# Patient Record
Sex: Female | Born: 1935 | Race: White | Hispanic: No
Health system: Southern US, Community
[De-identification: ages and names within clinical notes are randomized; demographics above are authoritative.]

## PROBLEM LIST (undated history)

## (undated) DIAGNOSIS — F0390 Unspecified dementia without behavioral disturbance: Secondary | ICD-10-CM

## (undated) DIAGNOSIS — I4891 Unspecified atrial fibrillation: Secondary | ICD-10-CM

## (undated) DIAGNOSIS — F10939 Alcohol use, unspecified with withdrawal, unspecified: Secondary | ICD-10-CM

## (undated) DIAGNOSIS — I509 Heart failure, unspecified: Secondary | ICD-10-CM

## (undated) DIAGNOSIS — F1020 Alcohol dependence, uncomplicated: Secondary | ICD-10-CM

## (undated) DIAGNOSIS — E872 Acidosis, unspecified: Secondary | ICD-10-CM

## (undated) DIAGNOSIS — F10239 Alcohol dependence with withdrawal, unspecified: Secondary | ICD-10-CM

## (undated) DIAGNOSIS — I4892 Unspecified atrial flutter: Secondary | ICD-10-CM

---

## 2017-07-24 ENCOUNTER — Other Ambulatory Visit: Payer: Self-pay

## 2017-07-24 ENCOUNTER — Observation Stay (HOSPITAL_COMMUNITY)
Admission: EM | Admit: 2017-07-24 | Discharge: 2017-07-28 | Disposition: A | Discharge status: Skilled Nursing Facility | Payer: Medicare Other | Attending: Internal Medicine | Admitting: Internal Medicine

## 2017-07-24 ENCOUNTER — Encounter (HOSPITAL_COMMUNITY): Payer: Self-pay | Admitting: Emergency Medicine

## 2017-07-24 ENCOUNTER — Emergency Department (HOSPITAL_COMMUNITY): Payer: Medicare Other

## 2017-07-24 DIAGNOSIS — G3189 Other specified degenerative diseases of nervous system: Secondary | ICD-10-CM | POA: Diagnosis not present

## 2017-07-24 DIAGNOSIS — D649 Anemia, unspecified: Secondary | ICD-10-CM | POA: Diagnosis not present

## 2017-07-24 DIAGNOSIS — R7989 Other specified abnormal findings of blood chemistry: Secondary | ICD-10-CM

## 2017-07-24 DIAGNOSIS — I509 Heart failure, unspecified: Secondary | ICD-10-CM | POA: Diagnosis not present

## 2017-07-24 DIAGNOSIS — F419 Anxiety disorder, unspecified: Secondary | ICD-10-CM | POA: Diagnosis not present

## 2017-07-24 DIAGNOSIS — F0390 Unspecified dementia without behavioral disturbance: Secondary | ICD-10-CM | POA: Diagnosis not present

## 2017-07-24 DIAGNOSIS — I480 Paroxysmal atrial fibrillation: Secondary | ICD-10-CM | POA: Diagnosis not present

## 2017-07-24 DIAGNOSIS — I110 Hypertensive heart disease with heart failure: Secondary | ICD-10-CM | POA: Diagnosis not present

## 2017-07-24 DIAGNOSIS — I700 Atherosclerosis of aorta: Secondary | ICD-10-CM | POA: Insufficient documentation

## 2017-07-24 DIAGNOSIS — B9689 Other specified bacterial agents as the cause of diseases classified elsewhere: Secondary | ICD-10-CM | POA: Diagnosis not present

## 2017-07-24 DIAGNOSIS — F1010 Alcohol abuse, uncomplicated: Secondary | ICD-10-CM | POA: Diagnosis present

## 2017-07-24 DIAGNOSIS — G9340 Encephalopathy, unspecified: Secondary | ICD-10-CM | POA: Diagnosis present

## 2017-07-24 DIAGNOSIS — R269 Unspecified abnormalities of gait and mobility: Secondary | ICD-10-CM | POA: Insufficient documentation

## 2017-07-24 DIAGNOSIS — G9341 Metabolic encephalopathy: Secondary | ICD-10-CM | POA: Diagnosis not present

## 2017-07-24 DIAGNOSIS — L8990 Pressure ulcer of unspecified site, unspecified stage: Secondary | ICD-10-CM | POA: Diagnosis not present

## 2017-07-24 DIAGNOSIS — I4892 Unspecified atrial flutter: Secondary | ICD-10-CM | POA: Insufficient documentation

## 2017-07-24 DIAGNOSIS — F329 Major depressive disorder, single episode, unspecified: Secondary | ICD-10-CM | POA: Insufficient documentation

## 2017-07-24 DIAGNOSIS — E871 Hypo-osmolality and hyponatremia: Secondary | ICD-10-CM | POA: Diagnosis not present

## 2017-07-24 DIAGNOSIS — R296 Repeated falls: Secondary | ICD-10-CM | POA: Diagnosis not present

## 2017-07-24 DIAGNOSIS — N390 Urinary tract infection, site not specified: Secondary | ICD-10-CM | POA: Diagnosis present

## 2017-07-24 DIAGNOSIS — Z79899 Other long term (current) drug therapy: Secondary | ICD-10-CM | POA: Diagnosis not present

## 2017-07-24 DIAGNOSIS — N3000 Acute cystitis without hematuria: Secondary | ICD-10-CM

## 2017-07-24 DIAGNOSIS — R778 Other specified abnormalities of plasma proteins: Secondary | ICD-10-CM

## 2017-07-24 DIAGNOSIS — I447 Left bundle-branch block, unspecified: Secondary | ICD-10-CM

## 2017-07-24 DIAGNOSIS — N179 Acute kidney failure, unspecified: Secondary | ICD-10-CM | POA: Diagnosis present

## 2017-07-24 HISTORY — DX: Unspecified atrial fibrillation: I48.91

## 2017-07-24 HISTORY — DX: Alcohol dependence, uncomplicated: F10.20

## 2017-07-24 HISTORY — DX: Unspecified atrial flutter: I48.92

## 2017-07-24 HISTORY — DX: Alcohol dependence with withdrawal, unspecified: F10.239

## 2017-07-24 HISTORY — DX: Acidosis: E87.2

## 2017-07-24 HISTORY — DX: Acidosis, unspecified: E87.20

## 2017-07-24 HISTORY — DX: Heart failure, unspecified: I50.9

## 2017-07-24 HISTORY — DX: Alcohol use, unspecified with withdrawal, unspecified: F10.939

## 2017-07-24 LAB — CBC WITH DIFFERENTIAL/PLATELET
BASOS ABS: 0 10*3/uL (ref 0.0–0.1)
Basophils Relative: 0 %
Eosinophils Absolute: 0 10*3/uL (ref 0.0–0.7)
Eosinophils Relative: 0 %
HEMATOCRIT: 34.6 % — AB (ref 36.0–46.0)
HEMOGLOBIN: 11.5 g/dL — AB (ref 12.0–15.0)
LYMPHS PCT: 8 %
Lymphs Abs: 0.7 10*3/uL (ref 0.7–4.0)
MCH: 30.6 pg (ref 26.0–34.0)
MCHC: 33.2 g/dL (ref 30.0–36.0)
MCV: 92 fL (ref 78.0–100.0)
MONO ABS: 1 10*3/uL (ref 0.1–1.0)
MONOS PCT: 10 %
NEUTROS ABS: 7.7 10*3/uL (ref 1.7–7.7)
NEUTROS PCT: 82 %
Platelets: 340 10*3/uL (ref 150–400)
RBC: 3.76 MIL/uL — ABNORMAL LOW (ref 3.87–5.11)
RDW: 15.3 % (ref 11.5–15.5)
WBC: 9.4 10*3/uL (ref 4.0–10.5)

## 2017-07-24 LAB — COMPREHENSIVE METABOLIC PANEL
ALK PHOS: 77 U/L (ref 38–126)
ALT: 18 U/L (ref 14–54)
AST: 19 U/L (ref 15–41)
Albumin: 3.4 g/dL — ABNORMAL LOW (ref 3.5–5.0)
Anion gap: 11 (ref 5–15)
BILIRUBIN TOTAL: 1 mg/dL (ref 0.3–1.2)
BUN: 26 mg/dL — AB (ref 6–20)
CALCIUM: 9.8 mg/dL (ref 8.9–10.3)
CO2: 23 mmol/L (ref 22–32)
Chloride: 95 mmol/L — ABNORMAL LOW (ref 101–111)
Creatinine, Ser: 1.2 mg/dL — ABNORMAL HIGH (ref 0.44–1.00)
GFR calc Af Amer: 46 mL/min — ABNORMAL LOW (ref >60)
GFR, EST NON AFRICAN AMERICAN: 40 mL/min — AB (ref >60)
GLUCOSE: 106 mg/dL — AB (ref 65–99)
POTASSIUM: 5 mmol/L (ref 3.5–5.1)
Sodium: 129 mmol/L — ABNORMAL LOW (ref 135–145)
TOTAL PROTEIN: 7 g/dL (ref 6.5–8.1)

## 2017-07-24 LAB — URINALYSIS, ROUTINE W REFLEX MICROSCOPIC
BILIRUBIN URINE: NEGATIVE
Glucose, UA: NEGATIVE mg/dL
Ketones, ur: 5 mg/dL — AB
Nitrite: POSITIVE — AB
PH: 5 (ref 5.0–8.0)
Protein, ur: NEGATIVE mg/dL
SPECIFIC GRAVITY, URINE: 1.011 (ref 1.005–1.030)

## 2017-07-24 LAB — RAPID URINE DRUG SCREEN, HOSP PERFORMED
AMPHETAMINES: NOT DETECTED
BARBITURATES: NOT DETECTED
BENZODIAZEPINES: POSITIVE — AB
COCAINE: NOT DETECTED
OPIATES: NOT DETECTED
TETRAHYDROCANNABINOL: NOT DETECTED

## 2017-07-24 LAB — ETHANOL

## 2017-07-24 LAB — TROPONIN I: TROPONIN I: 0.06 ng/mL — AB (ref <0.03)

## 2017-07-24 LAB — I-STAT TROPONIN, ED: Troponin i, poc: 0.07 ng/mL (ref 0.00–0.08)

## 2017-07-24 MED ORDER — DOCUSATE SODIUM 100 MG PO CAPS: 100.0000 mg | ORAL_CAPSULE | Freq: Every day | ORAL | Status: DC | PRN

## 2017-07-24 MED ORDER — MECLIZINE HCL 25 MG PO TABS: 12.5000 mg | ORAL_TABLET | Freq: Three times a day (TID) | ORAL | Status: DC | PRN

## 2017-07-24 MED ORDER — ONDANSETRON HCL 4 MG/2ML IJ SOLN: 4.0000 mg | Freq: Four times a day (QID) | INTRAMUSCULAR | Status: DC | PRN

## 2017-07-24 MED ORDER — ENOXAPARIN SODIUM 40 MG/0.4ML ~~LOC~~ SOLN
40.0000 mg | SUBCUTANEOUS | Status: DC
  Administered 2017-07-25 – 2017-07-28 (×4): 40 mg via SUBCUTANEOUS
  Filled 2017-07-24 (×4): qty 0.4

## 2017-07-24 MED ORDER — LORAZEPAM 1 MG PO TABS
1.0000 mg | ORAL_TABLET | Freq: Four times a day (QID) | ORAL | Status: AC | PRN
  Administered 2017-07-25 – 2017-07-27 (×2): 1 mg via ORAL
  Filled 2017-07-24 (×2): qty 1

## 2017-07-24 MED ORDER — CALCIUM CARBONATE-VITAMIN D 500-200 MG-UNIT PO TABS
0.5000 | ORAL_TABLET | Freq: Every day | ORAL | Status: DC
  Administered 2017-07-25 – 2017-07-28 (×4): 0.5 via ORAL
  Filled 2017-07-24 (×4): qty 1

## 2017-07-24 MED ORDER — SODIUM CHLORIDE 0.9 % IV BOLUS (SEPSIS)
1000.0000 mL | Freq: Once | INTRAVENOUS | Status: AC
  Administered 2017-07-24: 1000 mL via INTRAVENOUS

## 2017-07-24 MED ORDER — ACETAMINOPHEN 325 MG PO TABS
650.0000 mg | ORAL_TABLET | Freq: Four times a day (QID) | ORAL | Status: DC | PRN
  Administered 2017-07-27: 650 mg via ORAL
  Filled 2017-07-24: qty 2

## 2017-07-24 MED ORDER — SODIUM CHLORIDE 0.9 % IV SOLN
Freq: Once | INTRAVENOUS | Status: AC
  Administered 2017-07-24: via INTRAVENOUS

## 2017-07-24 MED ORDER — METOPROLOL SUCCINATE ER 50 MG PO TB24
50.0000 mg | ORAL_TABLET | Freq: Every day | ORAL | Status: DC
  Administered 2017-07-25 – 2017-07-28 (×4): 50 mg via ORAL
  Filled 2017-07-24 (×4): qty 1

## 2017-07-24 MED ORDER — DEXTROSE 5 % IV SOLN
1.0000 g | INTRAVENOUS | Status: DC
  Administered 2017-07-25: 1 g via INTRAVENOUS
  Filled 2017-07-24 (×2): qty 10

## 2017-07-24 MED ORDER — DIGOXIN 125 MCG PO TABS
0.1250 mg | ORAL_TABLET | Freq: Every day | ORAL | Status: DC
  Administered 2017-07-25 – 2017-07-28 (×4): 0.125 mg via ORAL
  Filled 2017-07-24 (×4): qty 1

## 2017-07-24 MED ORDER — SERTRALINE HCL 50 MG PO TABS
25.0000 mg | ORAL_TABLET | Freq: Every day | ORAL | Status: DC
  Administered 2017-07-25 – 2017-07-27 (×4): 25 mg via ORAL
  Filled 2017-07-24 (×4): qty 1

## 2017-07-24 MED ORDER — ACETAMINOPHEN 650 MG RE SUPP: 650.0000 mg | Freq: Four times a day (QID) | RECTAL | Status: DC | PRN

## 2017-07-24 MED ORDER — VITAMIN B-1 100 MG PO TABS
100.0000 mg | ORAL_TABLET | Freq: Every day | ORAL | Status: DC
  Administered 2017-07-25 – 2017-07-28 (×4): 100 mg via ORAL
  Filled 2017-07-24 (×4): qty 1

## 2017-07-24 MED ORDER — DEXTROSE 5 % IV SOLN
1.0000 g | Freq: Once | INTRAVENOUS | Status: AC
  Administered 2017-07-24: 1 g via INTRAVENOUS
  Filled 2017-07-24: qty 10

## 2017-07-24 MED ORDER — FOLIC ACID 1 MG PO TABS
1.0000 mg | ORAL_TABLET | Freq: Every day | ORAL | Status: DC
  Administered 2017-07-25 – 2017-07-28 (×4): 1 mg via ORAL
  Filled 2017-07-24 (×4): qty 1

## 2017-07-24 MED ORDER — TRAMADOL HCL 50 MG PO TABS: 50.0000 mg | ORAL_TABLET | Freq: Two times a day (BID) | ORAL | Status: DC | PRN

## 2017-07-24 MED ORDER — ALBUTEROL SULFATE (2.5 MG/3ML) 0.083% IN NEBU: 2.5000 mg | INHALATION_SOLUTION | Freq: Four times a day (QID) | RESPIRATORY_TRACT | Status: DC | PRN

## 2017-07-24 MED ORDER — ONDANSETRON HCL 4 MG PO TABS: 4.0000 mg | ORAL_TABLET | Freq: Four times a day (QID) | ORAL | Status: DC | PRN

## 2017-07-24 MED ORDER — LORAZEPAM 2 MG/ML IJ SOLN
1.0000 mg | Freq: Four times a day (QID) | INTRAMUSCULAR | Status: AC | PRN
  Administered 2017-07-25: 1 mg via INTRAVENOUS
  Filled 2017-07-24: qty 1

## 2017-07-24 MED ORDER — DULOXETINE HCL 30 MG PO CPEP
30.0000 mg | ORAL_CAPSULE | Freq: Every day | ORAL | Status: DC
  Administered 2017-07-25 – 2017-07-28 (×4): 30 mg via ORAL
  Filled 2017-07-24 (×4): qty 1

## 2017-07-24 MED ORDER — THIAMINE HCL 100 MG/ML IJ SOLN: 100.0000 mg | Freq: Every day | INTRAMUSCULAR | Status: DC

## 2017-07-24 MED ORDER — ADULT MULTIVITAMIN W/MINERALS CH
1.0000 | ORAL_TABLET | Freq: Every day | ORAL | Status: DC
  Administered 2017-07-25 – 2017-07-28 (×4): 1 via ORAL
  Filled 2017-07-24 (×4): qty 1

## 2017-07-24 MED ORDER — PANTOPRAZOLE SODIUM 40 MG PO TBEC
40.0000 mg | DELAYED_RELEASE_TABLET | Freq: Every day | ORAL | Status: DC
Start: 2017-07-25 — End: 2017-07-29
  Administered 2017-07-25 – 2017-07-28 (×4): 40 mg via ORAL
  Filled 2017-07-24 (×4): qty 1

## 2017-07-24 NOTE — ED Triage Notes (Signed)
Pt BIB EMS from [facility] s/p fall. This is the third fall in the last month. Patient hx dementia, but known for heavily drinking. Patient was found to have 6 empty bottles of liquor in her room. Patient fell today in the bathroom. Patient did not hit her head and no LOC. Patient has no complaints except she wants a drink.

## 2017-07-24 NOTE — H&P (Signed)
History and Physical    Jamie Le LKG:[id number] DOB: 09/23/1929 DOA: 07/24/2017  PCP: Manus GunningMazurek, Maggie, FNP  Patient coming from: [facility]  I have personally briefly reviewed patient[facility]  Chief Complaint: Fall  HPI: Jamie Le is a 83 y.o. female with medical history significant of EtOH abuse, dementia, PAF.  Patient recently put in [facility].  Still drinking, apparently daughter still supplys patient with Liquor.  Presents to ED s/p Fall.  3rd fall this past month.  No LOC, no complaints (other than she wants a drink of wine).  Apparently more confused recently, according to family 3 falls in past 3 days.  Poor appetite for past couple of days with no PO intake.   ED Course: Work up in ED shows UTI, EtOH level of 0.   Review of Systems: As per HPI otherwise 10 point review of systems negative.   Past Medical History:  Diagnosis Date  . A-fib (HCC)   . Acidosis   . Alcohol withdrawal (HCC)   . Alcoholism (HCC)   . Atrial flutter (HCC)   . CHF (congestive heart failure) (HCC)     History reviewed. No pertinent surgical history.   has an unknown smoking status. she has never used smokeless tobacco. She reports that she drinks alcohol. She reports that she does not use drugs.  No Known Allergies  History reviewed. No pertinent family history.   Prior to Admission medications   Medication Sig Start Date End Date Taking? Authorizing Provider  albuterol (PROVENTIL) (2.5 MG/3ML) 0.083% nebulizer solution Take 2.5 mg by nebulization every 6 (six) hours as needed for wheezing or shortness of breath.   Yes [provider]  ALPRAZolam Prudy Feeler(XANAX) 0.5 MG tablet Take 0.5 mg by mouth every 6 (six) hours as needed for anxiety.   Yes [provider]  Calcium Carbonate-Vitamin D3 (CALCIUM 600-D) 600-400 MG-UNIT TABS Take 0.5 tablets by mouth daily.   Yes [provider]  digoxin (LANOXIN) 0.125 MG tablet Take 0.125 mg  by mouth daily.   Yes [provider]  docusate sodium (COLACE) 100 MG capsule Take 100 mg by mouth daily as needed for mild constipation.   Yes [provider]  DULoxetine (CYMBALTA) 30 MG capsule Take 30 mg by mouth daily.   Yes [provider]  lisinopril (PRINIVIL,ZESTRIL) 10 MG tablet Take 10 mg by mouth daily.   Yes [provider]  meclizine (ANTIVERT) 12.5 MG tablet Take 12.5 mg by mouth 3 (three) times daily as needed for dizziness.   Yes [provider]  Melatonin 3 MG TABS Take 3 mg by mouth daily.   Yes [provider]  Metoprolol Succinate 50 MG CS24 Take 50 mg by mouth daily.   Yes [provider]  omeprazole (PRILOSEC) 20 MG capsule Take 20 mg by mouth daily.   Yes [provider]  sertraline (ZOLOFT) 25 MG tablet Take 25 mg by mouth at bedtime.   Yes [provider]  spironolactone (ALDACTONE) 25 MG tablet Take 25 mg by mouth 2 (two) times daily.   Yes [provider]  traMADol (ULTRAM) 50 MG tablet Take 50 mg by mouth every 12 (twelve) hours as needed for moderate pain.   Yes [provider]    Physical Exam: Vitals:   07/24/17 1942 07/24/17 2201  BP: (!) 154/90 140/72  Pulse: 85 89  Resp: 18 (!) 29  Temp: 98.2 F (36.8 C)   TempSrc: Oral   SpO2:  95% 97%    Constitutional: NAD, calm, comfortable Eyes: PERRL, lids and conjunctivae normal ENMT: Mucous membranes are moist. Posterior pharynx clear of any exudate or lesions.Normal dentition.  Neck: normal, supple, no masses, no thyromegaly Respiratory: clear to auscultation bilaterally, no wheezing, no crackles. Normal respiratory effort. No accessory muscle use.  Cardiovascular: Regular rate and rhythm, no murmurs / rubs / gallops. No extremity edema. 2+ pedal pulses. No carotid bruits.  Abdomen: no tenderness, no masses palpated. No hepatosplenomegaly. Bowel sounds positive.  Musculoskeletal: no clubbing / cyanosis. No  joint deformity upper and lower extremities. Good ROM, no contractures. Normal muscle tone.  Skin: no rashes, lesions, ulcers. No induration Neurologic: CN 2-12 grossly intact. Sensation intact, DTR normal. Strength 5/5 in all 4.  Psychiatric: Oriented to person, asking for 2 hot dogs and a bottle of wine that she thought she saw on the table in her ER room   Labs on Admission: I have personally reviewed following labs and imaging studies  CBC: Recent Labs  Lab 07/24/17 2053  WBC 9.4  NEUTROABS 7.7  HGB 11.5*  HCT 34.6*  MCV 92.0  PLT 340   Basic Metabolic Panel: Recent Labs  Lab 07/24/17 2053  NA 129*  K 5.0  CL 95*  CO2 23  GLUCOSE 106*  BUN 26*  CREATININE 1.20*  CALCIUM 9.8   GFR: CrCl cannot be calculated (Unknown ideal weight.). Liver Function Tests: Recent Labs  Lab 07/24/17 2053  AST 19  ALT 18  ALKPHOS 77  BILITOT 1.0  PROT 7.0  ALBUMIN 3.4*   No results for input(s): LIPASE, AMYLASE in the last 168 hours. No results for input(s): AMMONIA in the last 168 hours. Coagulation Profile: No results for input(s): INR, PROTIME in the last 168 hours. Cardiac Enzymes: Recent Labs  Lab 07/24/17 2102  TROPONINI 0.06*   BNP (last 3 results) No results for input(s): PROBNP in the last 8760 hours. HbA1C: No results for input(s): HGBA1C in the last 72 hours. CBG: No results for input(s): GLUCAP in the last 168 hours. Lipid Profile: No results for input(s): CHOL, HDL, LDLCALC, TRIG, CHOLHDL, LDLDIRECT in the last 72 hours. Thyroid Function Tests: No results for input(s): TSH, T4TOTAL, FREET4, T3FREE, THYROIDAB in the last 72 hours. Anemia Panel: No results for input(s): VITAMINB12, FOLATE, FERRITIN, TIBC, IRON, RETICCTPCT in the last 72 hours. Urine analysis:    Component Value Date/Time   COLORURINE YELLOW 07/24/2017 2053   APPEARANCEUR CLOUDY (A) 07/24/2017 2053   LABSPEC 1.011 07/24/2017 2053   PHURINE 5.0 07/24/2017 2053   GLUCOSEU NEGATIVE  07/24/2017 2053   HGBUR SMALL (A) 07/24/2017 2053   BILIRUBINUR NEGATIVE 07/24/2017 2053   KETONESUR 5 (A) 07/24/2017 2053   PROTEINUR NEGATIVE 07/24/2017 2053   NITRITE POSITIVE (A) 07/24/2017 2053   LEUKOCYTESUR MODERATE (A) 07/24/2017 2053    Radiological Exams on Admission: Dg Chest 2 View  Result Date: 07/24/2017 CLINICAL DATA:  Weakness, falls EXAM: CHEST  2 VIEW COMPARISON:  None. FINDINGS: No acute pulmonary infiltrate or effusion. Borderline to mild cardiomegaly. Aortic atherosclerosis. No pneumothorax. Postsurgical changes in the right axillary region. Minimal convex opacity at the right cardio phrenic sulcus, could relate to possible hiatal hernia. IMPRESSION: 1. Negative for acute infiltrate or edema 2. Borderline to mild cardiomegaly. 3. Convex opacity at the right cardiophrenic sulcus, possible hiatal hernia, short interval radiographic follow-up recommended. Electronically Signed   By: Jasmine PangKim  Fujinaga M.D.   On: 07/24/2017 21:26   Ct Head Wo Contrast  Result  Date: 07/24/2017 CLINICAL DATA:  Headache EXAM: CT HEAD WITHOUT CONTRAST TECHNIQUE: Contiguous axial images were obtained from the base of the skull through the vertex without intravenous contrast. COMPARISON:  None. FINDINGS: Brain: No acute territorial infarction, hemorrhage or intracranial mass is visualized. Extensive atrophy. Mild to moderate small vessel ischemic changes of the white matter. Prominent ventricles felt secondary to atrophy. Vascular: No hyperdense vessels.  Carotid artery calcification. Skull: No fracture Sinuses/Orbits: No acute finding. Other: None IMPRESSION: 1. No CT evidence for acute intracranial abnormality. 2. Atrophy and small vessel ischemic changes of the white matter. Electronically Signed   By: Jasmine Pang M.D.   On: 07/24/2017 21:20    EKG: Independently reviewed.  Assessment/Plan Principal Problem:   UTI (urinary tract infection) Active Problems:   Acute encephalopathy   ETOH abuse    AKI (acute kidney injury) (HCC)    1. UTI - 1. Rocephin 2. Cultures pending 2. Acute encephalopathy on chronic dementia - likely secondary to UTI 3. EtOH abuse - CIWA 4. Mild AKI - 1. holding lisinopril and spironolactone 2. Repeat BMP in AM  DVT prophylaxis: Lovenox Code Status: Full Family Communication: No family in room Disposition Plan: [facility] after admit Consults called: None Admission status: Place in [address], [place]. DO [facility] Pager [phone]  If 7AM-7PM, please contact day team taking care of patient [email or web address] Password [email or web address]  07/24/2017, 11:40 PM

## 2017-07-24 NOTE — ED Notes (Signed)
Assigned room 1502 @2342  call report @0002 

## 2017-07-24 NOTE — ED Provider Notes (Signed)
[facility]-EMERGENCY DEPT Provider Note   CSN: [id number] Arrival date & time: 07/24/17  1922     History   Chief Complaint Chief Complaint  Patient presents with  . Fall    HPI Jamie Le is a 83 y.o. female.  HPI  83 year old female with past medical history as below who presents with weakness and fall.  The patient has a history of dementia and is currently more confused than baseline.  According to the family, the patient has fallen 3 times in the last 3 days.  She is been increasingly confused.  She has had generalized fatigue and has had poor appetite.  She has been walking off away from staff at her assisted living, then is found down for unknown periods of time.  She recently fell and struck the back of her head.  She denies any complaints at this time.  Denies any chest pain.  Denies any abdominal pain.  Denies any nausea or vomiting.  History is provided with the assistance of her daughter, who is present.  Level 5 caveat invoked as remainder of history, ROS, and physical exam limited due to patient's dementia.  Past Medical History:  Diagnosis Date  . A-fib (HCC)   . Acidosis   . Alcohol withdrawal (HCC)   . Alcoholism (HCC)   . Atrial flutter (HCC)   . CHF (congestive heart failure) [facility](HCC)     Patient Active Problem List   Diagnosis Date Noted  . UTI (urinary tract infection) 07/24/2017  . Acute encephalopathy 07/24/2017  . ETOH abuse 07/24/2017  . AKI (acute kidney injury) (HCC) 07/24/2017    History reviewed. No pertinent surgical history.  OB History    No data available       Home Medications    Prior to Admission medications   Medication Sig Start Date End Date Taking? Authorizing Provider  albuterol (PROVENTIL) (2.5 MG/3ML) 0.083% nebulizer solution Take 2.5 mg by nebulization every 6 (six) hours as needed for wheezing or shortness of breath.   Yes [provider]  ALPRAZolam Prudy Feeler(XANAX) 0.5 MG tablet Take 0.5 mg  by mouth every 6 (six) hours as needed for anxiety.   Yes [provider]  Calcium Carbonate-Vitamin D3 (CALCIUM 600-D) 600-400 MG-UNIT TABS Take 0.5 tablets by mouth daily.   Yes [provider]  digoxin (LANOXIN) 0.125 MG tablet Take 0.125 mg by mouth daily.   Yes [provider]  docusate sodium (COLACE) 100 MG capsule Take 100 mg by mouth daily as needed for mild constipation.   Yes [provider]  DULoxetine (CYMBALTA) 30 MG capsule Take 30 mg by mouth daily.   Yes [provider]  lisinopril (PRINIVIL,ZESTRIL) 10 MG tablet Take 10 mg by mouth daily.   Yes [provider]  meclizine (ANTIVERT) 12.5 MG tablet Take 12.5 mg by mouth 3 (three) times daily as needed for dizziness.   Yes [provider]  Melatonin 3 MG TABS Take 3 mg by mouth daily.   Yes [provider]  Metoprolol Succinate 50 MG CS24 Take 50 mg by mouth daily.   Yes [provider]  omeprazole (PRILOSEC) 20 MG capsule Take 20 mg by mouth daily.   Yes [provider]  sertraline (ZOLOFT) 25 MG tablet Take 25 mg by mouth at bedtime.   Yes [provider]  spironolactone (ALDACTONE) 25 MG tablet Take 25 mg by mouth 2 (two) times daily.   Yes [provider]  traMADol Janean Sark(ULTRAM) 50  MG tablet Take 50 mg by mouth every 12 (twelve) hours as needed for moderate pain.   Yes [provider]    Family History History reviewed. No pertinent family history.  Social History Social History   Tobacco Use  . Smoking status: Unknown If Ever Smoked  . Smokeless tobacco: Never Used  Substance Use Topics  . Alcohol use: Yes    Comment: Moderate use  . Drug use: No     Allergies   Patient has no known allergies.   Review of Systems Review of Systems  Unable to perform ROS: Mental status change  Constitutional: Positive for fatigue.     Physical Exam Updated Vital Signs BP 140/72 (BP Location: Right Arm)   Pulse  89   Temp 98.2 F (36.8 C) (Oral)   Resp (!) 29   SpO2 97%   Physical Exam  Constitutional: She is oriented to person, place, and time. She appears well-developed and well-nourished. No distress.  HENT:  Head: Normocephalic and atraumatic.  Mildly dry mucous membranes  Eyes: Conjunctivae are normal.  Neck: Neck supple.  Cardiovascular: Normal rate, regular rhythm and normal heart sounds. Exam reveals no friction rub.  No murmur heard. Pulmonary/Chest: Effort normal and breath sounds normal. No respiratory distress. She has no wheezes. She has no rales.  Abdominal: She exhibits no distension. There is no tenderness. There is no guarding.  Nontender  Musculoskeletal: She exhibits no edema.  Neurological: She is alert and oriented to person, place, and time. She exhibits normal muscle tone.  Skin: Skin is warm. Capillary refill takes less than 2 seconds.  Psychiatric: She has a normal mood and affect.  Nursing note and vitals reviewed.    ED Treatments / Results  Labs (all labs ordered are listed, but only abnormal results are displayed) Labs Reviewed  CBC WITH DIFFERENTIAL/PLATELET - Abnormal; Notable for the following components:      Result Value   RBC 3.76 (*)    Hemoglobin 11.5 (*)    HCT 34.6 (*)    All other components within normal limits  COMPREHENSIVE METABOLIC PANEL - Abnormal; Notable for the following components:   Sodium 129 (*)    Chloride 95 (*)    Glucose, Bld 106 (*)    BUN 26 (*)    Creatinine, Ser 1.20 (*)    Albumin 3.4 (*)    GFR calc non Af Amer 40 (*)    GFR calc Af Amer 46 (*)    All other components within normal limits  URINALYSIS, ROUTINE W REFLEX MICROSCOPIC - Abnormal; Notable for the following components:   APPearance CLOUDY (*)    Hgb urine dipstick SMALL (*)    Ketones, ur 5 (*)    Nitrite POSITIVE (*)    Leukocytes, UA MODERATE (*)    Bacteria, UA MANY (*)    Squamous Epithelial / LPF 0-5 (*)    All other components within normal  limits  RAPID URINE DRUG SCREEN, HOSP PERFORMED - Abnormal; Notable for the following components:   Benzodiazepines POSITIVE (*)    All other components within normal limits  TROPONIN I - Abnormal; Notable for the following components:   Troponin I 0.06 (*)    All other components within normal limits  URINE CULTURE  ETHANOL  CBC  BASIC METABOLIC PANEL  I-STAT TROPONIN, ED    EKG  EKG Interpretation  Date/Time:  Monday July 24 2017 21:54:19 EST Ventricular Rate:  87 PR Interval:    QRS Duration:  129 QT Interval:  361 QTC Calculation: 435 R Axis:   -45 Text Interpretation:  Sinus rhythm Left bundle branch block No old tracing to compare No sgarbossa criteria Confirmed by Shaune Pollack [phone]) on 07/25/2017 12:03:41 AM       Radiology Dg Chest 2 View  Result Date: 07/24/2017 CLINICAL DATA:  Weakness, falls EXAM: CHEST  2 VIEW COMPARISON:  None. FINDINGS: No acute pulmonary infiltrate or effusion. Borderline to mild cardiomegaly. Aortic atherosclerosis. No pneumothorax. Postsurgical changes in the right axillary region. Minimal convex opacity at the right cardio phrenic sulcus, could relate to possible hiatal hernia. IMPRESSION: 1. Negative for acute infiltrate or edema 2. Borderline to mild cardiomegaly. 3. Convex opacity at the right cardiophrenic sulcus, possible hiatal hernia, short interval radiographic follow-up recommended. Electronically Signed   By: Jasmine Pang M.D.   On: 07/24/2017 21:26   Ct Head Wo Contrast  Result Date: 07/24/2017 CLINICAL DATA:  Headache EXAM: CT HEAD WITHOUT CONTRAST TECHNIQUE: Contiguous axial images were obtained from the base of the skull through the vertex without intravenous contrast. COMPARISON:  None. FINDINGS: Brain: No acute territorial infarction, hemorrhage or intracranial mass is visualized. Extensive atrophy. Mild to moderate small vessel ischemic changes of the white matter. Prominent ventricles felt secondary to atrophy.  Vascular: No hyperdense vessels.  Carotid artery calcification. Skull: No fracture Sinuses/Orbits: No acute finding. Other: None IMPRESSION: 1. No CT evidence for acute intracranial abnormality. 2. Atrophy and small vessel ischemic changes of the white matter. Electronically Signed   By: Jasmine Pang M.D.   On: 07/24/2017 21:20    Procedures Procedures (including critical care time)  Medications Ordered in ED Medications  acetaminophen (TYLENOL) tablet 650 mg (not administered)    Or  acetaminophen (TYLENOL) suppository 650 mg (not administered)  ondansetron (ZOFRAN) tablet 4 mg (not administered)    Or  ondansetron (ZOFRAN) injection 4 mg (not administered)  enoxaparin (LOVENOX) injection 40 mg (not administered)  cefTRIAXone (ROCEPHIN) 1 g in dextrose 5 % 50 mL IVPB (not administered)  LORazepam (ATIVAN) tablet 1 mg (not administered)    Or  LORazepam (ATIVAN) injection 1 mg (not administered)  thiamine (VITAMIN B-1) tablet 100 mg (not administered)    Or  thiamine (B-1) injection 100 mg (not administered)  folic acid (FOLVITE) tablet 1 mg (not administered)  multivitamin with minerals tablet 1 tablet (not administered)  albuterol (PROVENTIL) (2.5 MG/3ML) 0.083% nebulizer solution 2.5 mg (not administered)  Calcium Carbonate-Vitamin D3 600-400 MG-UNIT TABS 0.5 tablet (not administered)  digoxin (LANOXIN) tablet 0.125 mg (not administered)  docusate sodium (COLACE) capsule 100 mg (not administered)  DULoxetine (CYMBALTA) DR capsule 30 mg (not administered)  meclizine (ANTIVERT) tablet 12.5 mg (not administered)  Metoprolol Succinate CS24 50 mg (not administered)  pantoprazole (PROTONIX) EC tablet 40 mg (not administered)  sertraline (ZOLOFT) tablet 25 mg (not administered)  traMADol (ULTRAM) tablet 50 mg (not administered)  sodium chloride 0.9 % bolus 1,000 mL (0 mLs Intravenous Stopped 07/24/17 2300)  cefTRIAXone (ROCEPHIN) 1 g in dextrose 5 % 50 mL IVPB (0 g Intravenous Stopped  07/24/17 2335)  0.9 %  sodium chloride infusion ( Intravenous New Bag/Given 07/24/17 2334)     Initial Impression / Assessment and Plan / ED Course  I have reviewed the triage vital signs and the nursing notes.  Pertinent labs & imaging results that were available during my care of the patient were reviewed by me and considered in my medical decision making (see chart for details).  83 year old female with past medical history as above here with generalized weakness, increasing falls, and poor p.o. intake.  Lab work is consistent with likely prerenal AK I due to poor p.o. intake with UTI.  She also has moderately elevated troponin, which I suspect is demand.  EKG shows a left bundle branch.  She has no chest pain currently.  There are no old tracings but given absence of any kind of chest pain, shortness of breath, or other complaints, doubt acute ACS.  Will need to be trended.  Will give Rocephin, fluids, and admit to medicine.  This note was prepared with assistance of Conservation officer, historic buildings. Occasional wrong-word or sound-a-like substitutions may have occurred due to the inherent limitations of voice recognition software.  Final Clinical Impressions(s) / ED Diagnoses   Final diagnoses:  AKI (acute kidney injury) (HCC)  Lower urinary tract infectious disease  Elevated troponin  LBBB (left bundle branch block)    ED Discharge Orders    None       Shaune Pollack, MD 07/25/17 0004

## 2017-07-24 NOTE — ED Notes (Signed)
[facility] Expected date:  Expected time:  Means of arrival:  Comments: 83 yo fall

## 2017-07-24 NOTE — ED Notes (Signed)
Lab to add on troponin and urine culture.

## 2017-07-25 ENCOUNTER — Encounter (HOSPITAL_COMMUNITY): Payer: Self-pay

## 2017-07-25 DIAGNOSIS — F1010 Alcohol abuse, uncomplicated: Secondary | ICD-10-CM | POA: Diagnosis not present

## 2017-07-25 DIAGNOSIS — F419 Anxiety disorder, unspecified: Secondary | ICD-10-CM

## 2017-07-25 DIAGNOSIS — N390 Urinary tract infection, site not specified: Secondary | ICD-10-CM

## 2017-07-25 DIAGNOSIS — N179 Acute kidney failure, unspecified: Secondary | ICD-10-CM | POA: Diagnosis not present

## 2017-07-25 DIAGNOSIS — B9689 Other specified bacterial agents as the cause of diseases classified elsewhere: Secondary | ICD-10-CM | POA: Diagnosis not present

## 2017-07-25 DIAGNOSIS — L8990 Pressure ulcer of unspecified site, unspecified stage: Secondary | ICD-10-CM

## 2017-07-25 DIAGNOSIS — G9341 Metabolic encephalopathy: Secondary | ICD-10-CM | POA: Diagnosis not present

## 2017-07-25 DIAGNOSIS — I447 Left bundle-branch block, unspecified: Secondary | ICD-10-CM | POA: Diagnosis not present

## 2017-07-25 DIAGNOSIS — R748 Abnormal levels of other serum enzymes: Secondary | ICD-10-CM | POA: Diagnosis not present

## 2017-07-25 DIAGNOSIS — G9340 Encephalopathy, unspecified: Secondary | ICD-10-CM | POA: Diagnosis not present

## 2017-07-25 DIAGNOSIS — N3000 Acute cystitis without hematuria: Secondary | ICD-10-CM | POA: Diagnosis not present

## 2017-07-25 LAB — CBC
HCT: 31.7 % — ABNORMAL LOW (ref 36.0–46.0)
HEMOGLOBIN: 10.4 g/dL — AB (ref 12.0–15.0)
MCH: 30.1 pg (ref 26.0–34.0)
MCHC: 32.8 g/dL (ref 30.0–36.0)
MCV: 91.6 fL (ref 78.0–100.0)
PLATELETS: 324 10*3/uL (ref 150–400)
RBC: 3.46 MIL/uL — AB (ref 3.87–5.11)
RDW: 15.4 % (ref 11.5–15.5)
WBC: 7.7 10*3/uL (ref 4.0–10.5)

## 2017-07-25 LAB — BASIC METABOLIC PANEL
Anion gap: 9 (ref 5–15)
BUN: 19 mg/dL (ref 6–20)
CHLORIDE: 100 mmol/L — AB (ref 101–111)
CO2: 22 mmol/L (ref 22–32)
Calcium: 8.7 mg/dL — ABNORMAL LOW (ref 8.9–10.3)
Creatinine, Ser: 0.78 mg/dL (ref 0.44–1.00)
Glucose, Bld: 93 mg/dL (ref 65–99)
POTASSIUM: 4.1 mmol/L (ref 3.5–5.1)
SODIUM: 131 mmol/L — AB (ref 135–145)

## 2017-07-25 NOTE — Progress Notes (Signed)
PROGRESS NOTE  Jamie Le ZOX:[id number] DOB: 1930-01-25 DOA: 07/24/2017 PCP: Manus Gunning, FNP  HPI/Recap of past 24 hours: Jamie Le is a 83 y.o. female with medical history significant of EtOH abuse, dementia, PAF.  Patient is a resident of assisted living. Presents to ED s/p Fall.  3rd fall this past month.  No loss of consciousness. Per previous report more confused recently and 3 falls in past 3 days complicated by poor appetite and poor oral intake in the past 2 days.  Seen and examined at bedside. Alert but confused in the setting of dementia. Called family today twice and left message to call back.   Assessment/Plan: Principal Problem:   UTI (urinary tract infection) Active Problems:   Acute encephalopathy   ETOH abuse   AKI (acute kidney injury) (HCC)   Pressure injury of skin  Acute metabolic encephalopathy most likely 2/2 UTI, poa -Unable to reach family -Writer called family 07/25/17 and left message to be called back -IV rocephin day # 1  UTI, poa -urine cx in process -IV rocephin day#1 -afebrile no leukocytosis -cbc am  Etoh abuse with concern for withdrawal -CIWA protocol -vital signs stable  AKI, resolved -prerenal from dehydration -cr 0.70 from 1.20 -hold lisinopril and spironolactone -c/w IV fluid hydration  Ambulatory dysfunction -PT evaluate and treat -fall precaution when stable -multiple falls recently  Normocytic anemia -hg 10.4, mcv 91.6 -baseline hg 11 -no overt sign of bleeding -CBC am  HTN BP is stable Continue home meds  Anxiety/depression Continue home meds   Code Status: Full  Family Communication: Called number on the chart twice and left a message to call back  Disposition Plan: will stay another midnight to continue IV antibiotics   Consultants:  None  Procedures:  None   Antimicrobials:  IV  DVT prophylaxis:  sq lovenox 40 mg daily   Objective: Vitals:   07/25/17 0027 07/25/17 0245  07/25/17 0550 07/25/17 0800  BP: (!) 154/90  (!) 147/70 139/73  Pulse: 92  82 100  Resp: 20  20 20   Temp: 97.8 F (36.6 C)  98.2 F (36.8 C) 97.6 F (36.4 C)  TempSrc: Oral  Axillary Axillary  SpO2: 97%  95% 99%  Weight:  59 kg (130 lb 1.1 oz)    Height:  5\' 6"  (1.676 m)      Intake/Output Summary (Last 24 hours) at 07/25/2017 1409 Last data filed at 07/24/2017 2335 Gross per 24 hour  Intake 1050 ml  Output 850 ml  Net 200 ml   Filed Weights   07/25/17 0245  Weight: 59 kg (130 lb 1.1 oz)    Exam:  Constitutional: NAD, calm, comfortable Eyes: PERRL, lids and conjunctivae normal ENMT: Mucous membranes are moist. Posterior pharynx clear of any exudate or lesions.Normal dentition.  Neck: normal, supple, no masses, no thyromegaly Respiratory: clear to auscultation bilaterally, no wheezing, no crackles. Normal respiratory effort. No accessory muscle use.  Cardiovascular: Regular rate and rhythm, no murmurs / rubs / gallops. No extremity edema. 2+ pedal pulses. No carotid bruits.  Abdomen: no tenderness, no masses palpated. No hepatosplenomegaly. Bowel sounds positive.  Musculoskeletal: no clubbing / cyanosis. No joint deformity upper and lower extremities. Good ROM, no contractures. Normal muscle tone.  Skin: no rashes, lesions, ulcers. No induration Neurologic: CN 2-12 grossly intact. Sensation intact, DTR normal. Strength 5/5 in all 4.  Psych: unable to assess due to AMS   Data Reviewed: CBC: Recent Labs  Lab 07/24/17 2053 07/25/17 0504  WBC  9.4 7.7  NEUTROABS 7.7  --   HGB 11.5* 10.4*  HCT 34.6* 31.7*  MCV 92.0 91.6  PLT 340 324   Basic Metabolic Panel: Recent Labs  Lab 07/24/17 2053 07/25/17 0504  NA 129* 131*  K 5.0 4.1  CL 95* 100*  CO2 23 22  GLUCOSE 106* 93  BUN 26* 19  CREATININE 1.20* 0.78  CALCIUM 9.8 8.7*   GFR: Estimated Creatinine Clearance: 47.9 mL/min (by C-G formula based on SCr of 0.78 mg/dL). Liver Function Tests: Recent Labs  Lab  07/24/17 2053  AST 19  ALT 18  ALKPHOS 77  BILITOT 1.0  PROT 7.0  ALBUMIN 3.4*   No results for input(s): LIPASE, AMYLASE in the last 168 hours. No results for input(s): AMMONIA in the last 168 hours. Coagulation Profile: No results for input(s): INR, PROTIME in the last 168 hours. Cardiac Enzymes: Recent Labs  Lab 07/24/17 2102  TROPONINI 0.06*   BNP (last 3 results) No results for input(s): PROBNP in the last 8760 hours. HbA1C: No results for input(s): HGBA1C in the last 72 hours. CBG: No results for input(s): GLUCAP in the last 168 hours. Lipid Profile: No results for input(s): CHOL, HDL, LDLCALC, TRIG, CHOLHDL, LDLDIRECT in the last 72 hours. Thyroid Function Tests: No results for input(s): TSH, T4TOTAL, FREET4, T3FREE, THYROIDAB in the last 72 hours. Anemia Panel: No results for input(s): VITAMINB12, FOLATE, FERRITIN, TIBC, IRON, RETICCTPCT in the last 72 hours. Urine analysis:    Component Value Date/Time   COLORURINE YELLOW 07/24/2017 2053   APPEARANCEUR CLOUDY (A) 07/24/2017 2053   LABSPEC 1.011 07/24/2017 2053   PHURINE 5.0 07/24/2017 2053   GLUCOSEU NEGATIVE 07/24/2017 2053   HGBUR SMALL (A) 07/24/2017 2053   BILIRUBINUR NEGATIVE 07/24/2017 2053   KETONESUR 5 (A) 07/24/2017 2053   PROTEINUR NEGATIVE 07/24/2017 2053   NITRITE POSITIVE (A) 07/24/2017 2053   LEUKOCYTESUR MODERATE (A) 07/24/2017 2053   Sepsis Labs: @LABRCNTIP (procalcitonin:4,lacticidven:4)  )No results found for this or any previous visit (from the past 240 hour(s)).    Studies: Dg Chest 2 View  Result Date: 07/24/2017 CLINICAL DATA:  Weakness, falls EXAM: CHEST  2 VIEW COMPARISON:  None. FINDINGS: No acute pulmonary infiltrate or effusion. Borderline to mild cardiomegaly. Aortic atherosclerosis. No pneumothorax. Postsurgical changes in the right axillary region. Minimal convex opacity at the right cardio phrenic sulcus, could relate to possible hiatal hernia. IMPRESSION: 1. Negative for  acute infiltrate or edema 2. Borderline to mild cardiomegaly. 3. Convex opacity at the right cardiophrenic sulcus, possible hiatal hernia, short interval radiographic follow-up recommended. Electronically Signed   By: Jasmine PangKim  Fujinaga M.D.   On: 07/24/2017 21:26   Ct Head Wo Contrast  Result Date: 07/24/2017 CLINICAL DATA:  Headache EXAM: CT HEAD WITHOUT CONTRAST TECHNIQUE: Contiguous axial images were obtained from the base of the skull through the vertex without intravenous contrast. COMPARISON:  None. FINDINGS: Brain: No acute territorial infarction, hemorrhage or intracranial mass is visualized. Extensive atrophy. Mild to moderate small vessel ischemic changes of the white matter. Prominent ventricles felt secondary to atrophy. Vascular: No hyperdense vessels.  Carotid artery calcification. Skull: No fracture Sinuses/Orbits: No acute finding. Other: None IMPRESSION: 1. No CT evidence for acute intracranial abnormality. 2. Atrophy and small vessel ischemic changes of the white matter. Electronically Signed   By: Jasmine PangKim  Fujinaga M.D.   On: 07/24/2017 21:20    Scheduled Meds: . calcium-vitamin D  0.5 tablet Oral Daily  . digoxin  0.125 mg Oral Daily  .  DULoxetine  30 mg Oral Daily  . enoxaparin (LOVENOX) injection  40 mg Subcutaneous Q24H  . folic acid  1 mg Oral Daily  . metoprolol succinate  50 mg Oral Daily  . multivitamin with minerals  1 tablet Oral Daily  . pantoprazole  40 mg Oral Daily  . sertraline  25 mg Oral QHS  . thiamine  100 mg Oral Daily   Or  . thiamine  100 mg Intravenous Daily    Continuous Infusions: . cefTRIAXone (ROCEPHIN)  IV       LOS: 0 days     Darlin Droparole N Thula Stewart, MD [facility] Pager [phone]  If 7PM-7AM, please contact night-coverage [email or web address] Password [email or web address] 07/25/2017, 2:09 PM

## 2017-07-26 DIAGNOSIS — N3000 Acute cystitis without hematuria: Secondary | ICD-10-CM | POA: Diagnosis not present

## 2017-07-26 DIAGNOSIS — R748 Abnormal levels of other serum enzymes: Secondary | ICD-10-CM | POA: Diagnosis not present

## 2017-07-26 DIAGNOSIS — G9340 Encephalopathy, unspecified: Secondary | ICD-10-CM | POA: Diagnosis not present

## 2017-07-26 DIAGNOSIS — N179 Acute kidney failure, unspecified: Secondary | ICD-10-CM | POA: Diagnosis not present

## 2017-07-26 LAB — BASIC METABOLIC PANEL
ANION GAP: 9 (ref 5–15)
BUN: 12 mg/dL (ref 6–20)
CALCIUM: 8.6 mg/dL — AB (ref 8.9–10.3)
CO2: 20 mmol/L — AB (ref 22–32)
Chloride: 102 mmol/L (ref 101–111)
Creatinine, Ser: 0.69 mg/dL (ref 0.44–1.00)
GFR calc Af Amer: >60 mL/min (ref >60)
GLUCOSE: 88 mg/dL (ref 65–99)
Potassium: 4.1 mmol/L (ref 3.5–5.1)
Sodium: 131 mmol/L — ABNORMAL LOW (ref 135–145)

## 2017-07-26 LAB — CBC
HEMATOCRIT: 32.4 % — AB (ref 36.0–46.0)
Hemoglobin: 10.6 g/dL — ABNORMAL LOW (ref 12.0–15.0)
MCH: 30 pg (ref 26.0–34.0)
MCHC: 32.7 g/dL (ref 30.0–36.0)
MCV: 91.8 fL (ref 78.0–100.0)
PLATELETS: 312 10*3/uL (ref 150–400)
RBC: 3.53 MIL/uL — ABNORMAL LOW (ref 3.87–5.11)
RDW: 15.1 % (ref 11.5–15.5)
WBC: 5.7 10*3/uL (ref 4.0–10.5)

## 2017-07-26 MED ORDER — CIPROFLOXACIN HCL 500 MG PO TABS: 500.0000 mg | ORAL_TABLET | Freq: Two times a day (BID) | ORAL | 0 refills | Status: DC

## 2017-07-26 MED ORDER — CIPROFLOXACIN HCL 500 MG PO TABS
500.0000 mg | ORAL_TABLET | Freq: Two times a day (BID) | ORAL | Status: DC
  Administered 2017-07-26 – 2017-07-28 (×4): 500 mg via ORAL
  Filled 2017-07-26 (×4): qty 1

## 2017-07-26 NOTE — Discharge Summary (Addendum)
Discharge Summary  Jamie Le ZOX:[id number] DOB: 30-Oct-1929  PCP: Manus Gunning, FNP  Admit date: 07/24/2017 Discharge date: 07/26/2017  Time spent: 25 minutes   Recommendations for Outpatient Follow-up:  1. Follow up with your PCP within a week 2. Take your medications as prescribed 3. Urine culture >100,000 gram negative rods    UPDATE: PATIENT'S DISCHARGE WAS CANCELLED 07/28/17 DUE TO FAILED PT EVALUATION WITH AMBULATORY DYSFUNCTION. SOCIAL WORKER WORKING WITH DAUGHTER FOR PLACEMENT TO SNF.  Discharge Diagnoses:  Active Hospital Problems   Diagnosis Date Noted  . UTI (urinary tract infection) 07/24/2017  . Pressure injury of skin 07/25/2017  . Acute encephalopathy 07/24/2017  . ETOH abuse 07/24/2017  . AKI (acute kidney injury) (HCC) 07/24/2017    Resolved Hospital Problems  No resolved problems to display.    Discharge Condition: stable   Diet recommendation: resume previous diet   Vitals:   07/25/17 2003 07/26/17 0514  BP: (!) 158/82 (!) 152/76  Pulse: 99 82  Resp: 20 16  Temp: 98.6 F (37 C) 98.8 F (37.1 C)  SpO2: 97% 97%    History of present illness:  Jamie Le a 83 y.o.femalewith medical history significant ofEtOH abuse, dementia, PAF. Patient is a resident of assisted living. Presents to ED s/p Fall. 3rd fall this past month. No loss of consciousness. Per previous report more confused recently and 3 falls in past 3 days complicated bypoor appetite and poor oral intake in the past 2 days. PT and speech therapist consulted for evaluation.  Alert but confused in the setting of dementia. Called family twice and left message to call back. No calls back.  Patient will be discharged to her assisted living if she passes PT evaluation and swallow evaluation. Otherwise may require SNF for 24 hour assistance.    Hospital Course:  Principal Problem:   UTI (urinary tract infection) Active Problems:   Acute encephalopathy   ETOH abuse  AKI (acute kidney injury) (HCC)   Pressure injury of skin  Acute metabolic encephalopathy most likely 2/2 UTI, poa -Unclear baseline -Unable to reach family -Writer called family 07/25/17 and left message to be called back -IV rocephin day # 2  UTI, poa -urine cx in process -IV rocephin day#2 -afebrile no leukocytosis -discharge on ciprofloxacin 500 mg BID x 7 days.  Etoh abuse with concern for withdrawal -CIWA protocol -vital signs stable  AKI, resolved -prerenal from dehydration -cr 0.69 from 0.78 from 1.20 -resume lisinopril and spironolactone -c/w IV fluid hydration  Hyponatremia -no baseline available -Na+ 129 2 days -Na+ 131 -Repeat BMP  Ambulatory dysfunction -PT evaluate and treat -fall precaution when stable -multiple falls recently  Normocytic anemia -hg 10.4, mcv 91.6 -baseline hg 11 -no overt sign of bleeding -CBC am  HTN BP is stable Continue home meds  Anxiety/depression Continue home meds   Procedures:  none  Consultations:  none  Discharge Exam: BP (!) 152/76 (BP Location: Right Arm)   Pulse 82   Temp 98.8 F (37.1 C) (Oral)   Resp 16   Ht 5\' 6"  (1.676 m)   Wt 59 kg (130 lb 1.1 oz)   SpO2 97%   BMI 20.99 kg/m   General: 83 yo CF WD WN NAD alert but confused in the setting of dementia Cardiovascular: RRR no rubs or gallops Respiratory: CTA no rales or wheezes   Discharge Instructions You were cared for by a hospitalist during your hospital stay. If you have any questions about your discharge medications or the care you  received while you were in the hospital after you are discharged, you can call the unit and asked to speak with the hospitalist on call if the hospitalist that took care of you is not available. Once you are discharged, your primary care physician will handle any further medical issues. Please note that NO REFILLS for any discharge medications will be authorized once you are discharged, as it is imperative  that you return to your primary care physician (or establish a relationship with a primary care physician if you do not have one) for your aftercare needs so that they can reassess your need for medications and monitor your lab values.   Allergies as of 07/26/2017   No Known Allergies     Medication List    STOP taking these medications   ALPRAZolam 0.5 MG tablet Commonly known as:  XANAX   traMADol 50 MG tablet Commonly known as:  ULTRAM     TAKE these medications   albuterol (2.5 MG/3ML) 0.083% nebulizer solution Commonly known as:  PROVENTIL Take 2.5 mg by nebulization every 6 (six) hours as needed for wheezing or shortness of breath.   CALCIUM 600-D 600-400 MG-UNIT Tabs Generic drug:  Calcium Carbonate-Vitamin D3 Take 0.5 tablets by mouth daily.   ciprofloxacin 500 MG tablet Commonly known as:  CIPRO Take 1 tablet (500 mg total) by mouth 2 (two) times daily for 7 days.   digoxin 0.125 MG tablet Commonly known as:  LANOXIN Take 0.125 mg by mouth daily.   docusate sodium 100 MG capsule Commonly known as:  COLACE Take 100 mg by mouth daily as needed for mild constipation.   DULoxetine 30 MG capsule Commonly known as:  CYMBALTA Take 30 mg by mouth daily.   lisinopril 10 MG tablet Commonly known as:  PRINIVIL,ZESTRIL Take 10 mg by mouth daily.   meclizine 12.5 MG tablet Commonly known as:  ANTIVERT Take 12.5 mg by mouth 3 (three) times daily as needed for dizziness.   Melatonin 3 MG Tabs Take 3 mg by mouth daily.   Metoprolol Succinate 50 MG Cs24 Take 50 mg by mouth daily.   omeprazole 20 MG capsule Commonly known as:  PRILOSEC Take 20 mg by mouth daily.   sertraline 25 MG tablet Commonly known as:  ZOLOFT Take 25 mg by mouth at bedtime.   spironolactone 25 MG tablet Commonly known as:  ALDACTONE Take 25 mg by mouth 2 (two) times daily.      No Known Allergies Follow-up Information    Manus GunningMazurek, Maggie, FNP Follow up.   Specialty:  Nurse  Practitioner Contact information: [address] [place] [address] [phone]            The results of significant diagnostics from this hospitalization (including imaging, microbiology, ancillary and laboratory) are listed below for reference.    Significant Diagnostic Studies: Dg Chest 2 View  Result Date: 07/24/2017 CLINICAL DATA:  Weakness, falls EXAM: CHEST  2 VIEW COMPARISON:  None. FINDINGS: No acute pulmonary infiltrate or effusion. Borderline to mild cardiomegaly. Aortic atherosclerosis. No pneumothorax. Postsurgical changes in the right axillary region. Minimal convex opacity at the right cardio phrenic sulcus, could relate to possible hiatal hernia. IMPRESSION: 1. Negative for acute infiltrate or edema 2. Borderline to mild cardiomegaly. 3. Convex opacity at the right cardiophrenic sulcus, possible hiatal hernia, short interval radiographic follow-up recommended. Electronically Signed   By: Jasmine PangKim  Fujinaga M.D.   On: 07/24/2017 21:26   Ct Head Wo Contrast  Result Date: 07/24/2017  CLINICAL DATA:  Headache EXAM: CT HEAD WITHOUT CONTRAST TECHNIQUE: Contiguous axial images were obtained from the base of the skull through the vertex without intravenous contrast. COMPARISON:  None. FINDINGS: Brain: No acute territorial infarction, hemorrhage or intracranial mass is visualized. Extensive atrophy. Mild to moderate small vessel ischemic changes of the white matter. Prominent ventricles felt secondary to atrophy. Vascular: No hyperdense vessels.  Carotid artery calcification. Skull: No fracture Sinuses/Orbits: No acute finding. Other: None IMPRESSION: 1. No CT evidence for acute intracranial abnormality. 2. Atrophy and small vessel ischemic changes of the white matter. Electronically Signed   By: Jasmine PangKim  Fujinaga M.D.   On: 07/24/2017 21:20    Microbiology: Recent Results (from the past 240 hour(s))  Urine culture     Status: Abnormal (Preliminary result)   Collection Time:  07/24/17 10:40 PM  Result Value Ref Range Status   Specimen Description URINE, CLEAN CATCH  Final   Special Requests Normal  Final   Culture >=100,000 COLONIES/mL GRAM NEGATIVE RODS (A)  Final   Report Status PENDING  Incomplete     Labs: Basic Metabolic Panel: Recent Labs  Lab 07/24/17 2053 07/25/17 0504 07/26/17 0435  NA 129* 131* 131*  K 5.0 4.1 4.1  CL 95* 100* 102  CO2 23 22 20*  GLUCOSE 106* 93 88  BUN 26* 19 12  CREATININE 1.20* 0.78 0.69  CALCIUM 9.8 8.7* 8.6*   Liver Function Tests: Recent Labs  Lab 07/24/17 2053  AST 19  ALT 18  ALKPHOS 77  BILITOT 1.0  PROT 7.0  ALBUMIN 3.4*   No results for input(s): LIPASE, AMYLASE in the last 168 hours. No results for input(s): AMMONIA in the last 168 hours. CBC: Recent Labs  Lab 07/24/17 2053 07/25/17 0504 07/26/17 0435  WBC 9.4 7.7 5.7  NEUTROABS 7.7  --   --   HGB 11.5* 10.4* 10.6*  HCT 34.6* 31.7* 32.4*  MCV 92.0 91.6 91.8  PLT 340 324 312   Cardiac Enzymes: Recent Labs  Lab 07/24/17 2102  TROPONINI 0.06*   BNP: BNP (last 3 results) No results for input(s): BNP in the last 8760 hours.  ProBNP (last 3 results) No results for input(s): PROBNP in the last 8760 hours.  CBG: No results for input(s): GLUCAP in the last 168 hours.     Signed:  Darlin Droparole N Gelena Klosinski, MD [facility] 07/26/2017, 1:12 PM

## 2017-07-26 NOTE — Evaluation (Signed)
Physical Therapy Evaluation Patient Details Name: Jamie KindShirley Le MRN: [id number] DOB: 10/16/1929 Today's Date: 07/26/2017   History of Present Illness  Jamie Le is a 83 y.o. female with medical history significant of EtOH abuse, dementia, PAF.  Patient is a resident of assisted living. Presents to ED s/p Fall.  3rd fall this past month.  No loss of consciousness. Per previous report more confused recently and 3 falls in past 3 days complicated by poor appetite and poor oral intake in the past 2 days.  Clinical Impression  The patient  Incontinent of bladder upon standing. [facility] The patient is not oriented and requires frequent cues for safety. Pt admitted with above diagnosis. Pt currently with functional limitations due to the deficits listed below (see PT Problem List).  Pt will benefit from skilled PT to increase their independence and safety with mobility to allow discharge to the venue listed below.       Follow Up Recommendations SNF(unles is at ALF level of care, clearly needs more close supevision)    Equipment Recommendations  None recommended by PT    Recommendations for Other Services       Precautions / Restrictions Precautions Precautions: Fall Precaution Comments: incontinent= quickly upon standing. Restrictions Weight Bearing Restrictions: No      Mobility  Bed Mobility               General bed mobility comments: in recliner  Transfers Overall transfer level: Needs assistance Equipment used: Rolling walker (2 wheeled) Transfers: Sit to/from [employer] CorporationStand;Stand Pivot Transfers Sit to Stand: Min assist Stand pivot transfers: Min assist       General transfer comment: incontinent of urine immediately after standing up. [facility] to [facility]iner. multimodal cues for safety, decreased control of descent to recliner and fell into it.  Ambulation/Gait                Stairs            Wheelchair Mobility    Modified  Rankin (Stroke Patients Only)       Balance Overall balance assessment: History of Falls;Needs assistance Sitting-balance support: No upper extremity supported;Feet supported Sitting balance-Leahy Scale: Fair     Standing balance support: Bilateral upper extremity supported;During functional activity Standing balance-Leahy Scale: Poor                               Pertinent Vitals/Pain Pain Assessment: Faces Faces Pain Scale: Hurts even more Pain Location: right arm when used for support Pain Descriptors / Indicators: Discomfort;Grimacing;Moaning Pain Intervention(s): Limited activity within patient's tolerance;Monitored during session    Home Living                   Additional Comments: per chart from [facility]- Independent vs ALF- unsure    Prior Function           Comments: uncertain- multiple falls     Hand Dominance        Extremity/Trunk Assessment   Upper Extremity Assessment Upper Extremity Assessment: Generalized weakness    Lower Extremity Assessment Lower Extremity Assessment: Generalized weakness;RLE deficits/detail;LLE deficits/detail RLE Deficits / Details: discolration lower legs LLE Deficits / Details: same as right    Cervical / Trunk Assessment Cervical / Trunk Assessment: Normal  Communication   Communication: Other (comment)  Cognition Arousal/Alertness: Awake/alert Behavior During Therapy: Impulsive Overall Cognitive Status: No family/caregiver present to determine baseline  cognitive functioning Area of Impairment: Orientation;Attention;Memory;Following commands;Safety/judgement;Awareness                 Orientation Level: Time;Place;Situation Current Attention Level: Divided Memory: Decreased short-term memory Following Commands: Follows one step commands inconsistently       General Comments: some expressive difficulties, trying to sayn [place]      General Comments      Exercises      Assessment/Plan    PT Assessment Patient needs continued PT services  PT Problem List Decreased strength;Decreased activity tolerance;Decreased balance;Decreased mobility;Decreased knowledge of precautions;Decreased safety awareness;Decreased knowledge of use of DME;Decreased cognition       PT Treatment Interventions DME instruction;Gait training;Balance training;Functional mobility training;Cognitive remediation;Therapeutic activities    PT Goals (Current goals can be found in the Care Plan section)  Acute Rehab PT Goals PT Goal Formulation: Patient unable to participate in goal setting Time For Goal Achievement: 08/09/17 Potential to Achieve Goals: Good    Frequency Min 2X/week   Barriers to discharge Decreased caregiver support      Co-evaluation               AM-PAC PT "6 Clicks" Daily Activity  Outcome Measure Difficulty turning over in bed (including adjusting bedclothes, sheets and blankets)?: A Little Difficulty moving from lying on back to sitting on the side of the bed? : A Little Difficulty sitting down on and standing up from a chair with arms (e.g., wheelchair, bedside commode, etc,.)?: A Little Help needed moving to and from a bed to chair (including a wheelchair)?: A Little Help needed walking in hospital room?: A Lot Help needed climbing 3-5 steps with a railing? : A Lot 6 Click Score: 16    End of Session Equipment Utilized During Treatment: Gait belt Activity Tolerance: Patient tolerated treatment well Patient left: in chair;with call bell/phone within reach;with chair alarm set Nurse Communication: Mobility status PT Visit Diagnosis: Difficulty in walking, not elsewhere classified (R26.2);History of falling (Z91.81)    Time: [id number] PT Time Calculation (min) (ACUTE ONLY): 19 min   Charges:   PT Evaluation $PT Eval Low Complexity: 1 Low     PT G Codes:   PT G-Codes **NOT FOR INPATIENT CLASS** Functional Assessment Tool Used: AM-PAC 6  Clicks Basic Mobility;Clinical judgement Functional Limitation: Mobility: Walking and moving around Mobility: Walking and Moving Around Current Status ([id number]([id number]): At least 40 percent but less than 60 percent impaired, limited or restricted Mobility: Walking and Moving Around Goal Status [phone](G8979): At least 1 percent but less than 20 percent impaired, limited or restricted    [facility] Medical CenterKaren Jaiceon Collister PT [phone]   Rada HayHill, Chozen Latulippe Elizabeth 07/26/2017, 1:03 PM

## 2017-07-26 NOTE — Discharge Instructions (Signed)

## 2017-07-26 NOTE — Progress Notes (Signed)
Date: July 26, 2017 Meri Pelot, BSN, RN3, CCM [phone] Chart and notes review for patient progress and needs. Will follow for case management and discharge needs. Next review date: [id number] 

## 2017-07-26 NOTE — Evaluation (Signed)
SLP Cancellation Note  Patient Details Name: Jamie Le MRN: [id number] DOB: 02/22/1930   Cancelled treatment:       Reason Eval/Treat Not Completed: Other (comment)(order for swallow evaluation received, pt sitting up in room on telephone with RN, RN reports she has not observed dysphagia symptoms, will see pt next date am.  Thanks for this order)   Chales AbrahamsKimball, Pearlie Lafosse Ann 07/26/2017, 3:57 PM  Donavan Burnetamara Andrus Sharp, MS [facility] SLP [phone]

## 2017-07-26 NOTE — Plan of Care (Signed)
  Progressing Clinical Measurements: Ability to maintain clinical measurements within normal limits will improve 07/26/2017 2230 - Progressing by Cristela FeltSteffens, Khyrin Trevathan P, RN Diagnostic test results will improve 07/26/2017 2230 - Progressing by Cristela FeltSteffens, Jaleeyah Munce P, RN Respiratory complications will improve 07/26/2017 2230 - Progressing by Cristela FeltSteffens, Kadeja Granada P, RN Cardiovascular complication will be avoided 07/26/2017 2230 - Progressing by Cristela FeltSteffens, Lovelyn Sheeran P, RN Nutrition: Adequate nutrition will be maintained 07/26/2017 2230 - Progressing by Cristela FeltSteffens, Mesa Janus P, RN Coping: Level of anxiety will decrease 07/26/2017 2230 - Progressing by Cristela FeltSteffens, Misty Rago P, RN Elimination: Will not experience complications related to urinary retention 07/26/2017 2230 - Progressing by Cristela FeltSteffens, Jermie Hippe P, RN Pain Managment: General experience of comfort will improve 07/26/2017 2230 - Progressing by Cristela FeltSteffens, Dore Oquin P, RN Safety: Ability to remain free from injury will improve 07/26/2017 2230 - Progressing by Cristela FeltSteffens, Shianne Zeiser P, RN

## 2017-07-26 NOTE — Progress Notes (Signed)
[id number]/Sherley Leser,BSN,RN3,CCM:patient dcd to home/

## 2017-07-26 NOTE — Care Management Obs Status (Signed)
MEDICARE OBSERVATION STATUS NOTIFICATION   Patient Details  Name: Jamie Le MRN: [id number] Date of Birth: 04/08/1930   Medicare Observation Status Notification Given:       Golda AcreDavis, Rhonda Lynn, RN 07/26/2017, 2:00 PM

## 2017-07-27 DIAGNOSIS — R748 Abnormal levels of other serum enzymes: Secondary | ICD-10-CM | POA: Diagnosis not present

## 2017-07-27 DIAGNOSIS — N179 Acute kidney failure, unspecified: Secondary | ICD-10-CM | POA: Diagnosis not present

## 2017-07-27 DIAGNOSIS — N3000 Acute cystitis without hematuria: Secondary | ICD-10-CM | POA: Diagnosis not present

## 2017-07-27 DIAGNOSIS — G9340 Encephalopathy, unspecified: Secondary | ICD-10-CM | POA: Diagnosis not present

## 2017-07-27 LAB — URINE CULTURE: Special Requests: NORMAL

## 2017-07-27 LAB — MRSA PCR SCREENING: MRSA by PCR: POSITIVE — AB

## 2017-07-27 NOTE — Progress Notes (Signed)
Physical Therapy Treatment Patient Details Name: Jamie Le MRN: [id number] DOB: 04/30/1930 Today's Date: 07/27/2017    History of Present Illness Jamie Le is a 83 y.o. female with medical history significant of EtOH abuse, dementia, PAF.  Patient is a resident of assisted living. Presents to ED s/p Fall.  3rd fall this past month.  No loss of consciousness. Per previous report more confused recently and 3 falls in past 3 days complicated by poor appetite and poor oral intake in the past 2 days.    PT Comments    The patient is pleasant and confused. Ambulated with  RW and 1 assist  to Bathroom and back to recliner, patient would not ambulate out of the Room today. Requires assistance for balance throughout.    Follow Up Recommendations  SNF     Equipment Recommendations  None recommended by PT    Recommendations for Other Services       Precautions / Restrictions Precautions Precautions: Fall Precaution Comments: incontinent= quickly upon standing.    Mobility  Bed Mobility               General bed mobility comments: in recliner  Transfers Overall transfer level: Needs assistance Equipment used: Rolling walker (2 wheeled) Transfers: Sit to/from Stand Sit to Stand: Min assist         General transfer comment: multimodal cues for safety and use of RW around  Ambulation/Gait Ambulation/Gait assistance: Min assist Ambulation Distance (Feet): 20 Feet(x 2) Assistive device: Rolling walker (2 wheeled) Gait Pattern/deviations: Step-to pattern;Step-through pattern;Staggering right;Drifts right/left     General Gait Details: assisted to ambulate to the bathroom but patient would not venture into hallway.    Stairs            Wheelchair Mobility    Modified Rankin (Stroke Patients Only)       Balance Overall balance assessment: History of Falls;Needs assistance Sitting-balance support: No upper extremity supported Sitting balance-Leahy Scale:  Fair     Standing balance support: During functional activity;No upper extremity supported Standing balance-Leahy Scale: Poor Standing balance comment: staggering                            Cognition Arousal/Alertness: Awake/alert     Area of Impairment: Orientation;Attention;Memory;Safety/judgement                 Orientation Level: Time;Place;Situation Current Attention Level: Divided Memory: Decreased short-term memory Following Commands: Follows one step commands inconsistently Safety/Judgement: Decreased awareness of safety            Exercises      General Comments        Pertinent Vitals/Pain Pain Assessment: Faces Faces Pain Scale: No hurt    Home Living                      Prior Function            PT Goals (current goals can now be found in the care plan section) Progress towards PT goals: Progressing toward goals    Frequency    Min 2X/week      PT Plan Current plan remains appropriate    Co-evaluation              AM-PAC PT "6 Clicks" Daily Activity  Outcome Measure  Difficulty turning over in bed (including adjusting bedclothes, sheets and blankets)?: A Little Difficulty moving from lying on back to sitting on  the side of the bed? : A Little Difficulty sitting down on and standing up from a chair with arms (e.g., wheelchair, bedside commode, etc,.)?: A Little Help needed moving to and from a bed to chair (including a wheelchair)?: A Lot Help needed walking in hospital room?: A Lot Help needed climbing 3-5 steps with a railing? : A Lot 6 Click Score: 15    End of Session   Activity Tolerance: Patient tolerated treatment well Patient left: in chair;with call bell/phone within reach;with chair alarm set Nurse Communication: Mobility status PT Visit Diagnosis: Difficulty in walking, not elsewhere classified (R26.2);History of falling (Z91.81)     Time: 1541-1600 PT Time Calculation (min) (ACUTE  ONLY): 19 min  Charges:  $Gait Training: 8-22 mins                    G CodesBlanchard Le:       Jamie Le PT [phone]    Rada HayHill, Ashtynn Berke Elizabeth 07/27/2017, 4:05 PM

## 2017-07-27 NOTE — Evaluation (Signed)
Clinical/Bedside Swallow Evaluation Patient Details  Name: Jamie Le MRN: [id number] Date of Birth: 04/03/1930  Today's Date: 07/27/2017 Time: SLP Start Time (ACUTE ONLY): 1020 SLP Stop Time (ACUTE ONLY): 1035 SLP Time Calculation (min) (ACUTE ONLY): 15 min  Past Medical History:  Past Medical History:  Diagnosis Date  . A-fib (HCC)   . Acidosis   . Alcohol withdrawal (HCC)   . Alcoholism (HCC)   . Atrial flutter (HCC)   . CHF (congestive heart failure) (HCC)    Past Surgical History: History reviewed. No pertinent surgical history. HPI:  83 [facility], pt found to have UTI.  PMH + for ETOH use and [facility].  Pt CXR negative ? hiatal hernia.  Pt CT head negative.  Swallow evaluation ordered. Pt denies dysphagia.     Assessment / Plan / Recommendation Clinical Impression  Patient presents with functional oropharyngeal swallow ability with no indications of dysphagia/airway compromise with po observed.   Pt challenged consume 3 ounces water sequentially with good tolerance.     She does appear with left lower facial asymmetry - and noted ? minimal residuals of toothpaste on left upper lip without awareness.  Unknown source?  No SLP follow up indicated as pt with functional swallow.  Thanks.  SLP Visit Diagnosis: Dysphagia, unspecified (R13.10)    Aspiration Risk  No limitations    Diet Recommendation Regular;Thin liquid   Liquid Administration via: Cup;Straw Medication Administration: Whole meds with liquid Supervision: Patient able to self feed Compensations: Slow rate;Small sips/bites Postural Changes: Seated upright at 90 degrees    Other  Recommendations Oral Care Recommendations: Oral care BID   Follow up Recommendations None      Frequency and Duration            Prognosis        Swallow Study   General Date of Onset: 07/27/17 HPI: 83 [facility], pt found to have UTI.  PMH + for ETOH use and she  resides [facility].  Pt CXR negative ? hiatal hernia.  Pt CT head negative.  Swallow evaluation ordered. Pt denies dysphagia.   Type of Study: Bedside Swallow Evaluation Diet Prior to this Study: Regular;Thin liquids Temperature Spikes Noted: No Respiratory Status: Room air History of Recent Intubation: No Behavior/Cognition: Alert;Cooperative;Pleasant mood Oral Cavity Assessment: Within Functional Limits Oral Care Completed by SLP: No Oral Cavity - Dentition: Adequate natural dentition Vision: Functional for self-feeding Self-Feeding Abilities: Able to feed self Patient Positioning: Upright in bed Baseline Vocal Quality: Normal Volitional Cough: Strong Volitional Swallow: Able to elicit    Oral/Motor/Sensory Function Overall Oral Motor/Sensory Function: Mild impairment Facial Symmetry: Abnormal symmetry left Facial Sensation: Reduced left   Ice Chips Ice chips: Not tested   Thin Liquid Thin Liquid: Within functional limits Presentation: Self Fed;Cup    Nectar Thick Nectar Thick Liquid: Not tested   Honey Thick Honey Thick Liquid: Not tested   Puree Puree: Not tested   Solid   GO   Solid: Within functional limits Presentation: Self Fed    Functional Assessment Tool Used: Yale 3 ounce water test, clinical judgement Functional Limitations: Swallowing Swallow Current Status [phone](G8996): 0 percent impaired, limited or restricted Swallow Goal Status ([id number](G8997): 0 percent impaired, limited or restricted Swallow Discharge Status [phone](G8998): 0 percent impaired, limited or restricted   Chales AbrahamsKimball, Caidon Foti Ann 07/27/2017,10:42 AM  Donavan Burnetamara Vernisha Bacote, MS [facility] SLP [phone]

## 2017-07-27 NOTE — Clinical Social Work Note (Signed)
Clinical Social Work Assessment  Patient Details  Name: Jamie Le MRN: [id number] Date of Birth: 02/06/1930  Date of referral:  07/27/17               Reason for consult:  Facility Placement, Discharge Planning                Permission sought to share information with:  Case Manager, Magazine features editoracility Contact Representative, Family Supports Permission granted to share information::  Yes, Verbal Permission Granted  Name::     Print production plannerCindy and Jamie Le  Agency::  [facility], SNFs  Relationship::  Daughters  SolicitorContact Information:     Housing/Transportation Living arrangements for the past 2 months:  Skilled Holiday representativeursing Facility, Assisted Living Facility, Single Family Home Source of Information:  Adult Children, Facility Patient Interpreter Needed:  None Criminal Activity/Legal Involvement Pertinent to Current Situation/Hospitalization:  No - Comment as needed Significant Relationships:  Adult Children, Merchandiser, retailCommunity Support Lives with:  Facility Resident Do you feel safe going back to the place where you live?  Yes Need for family participation in patient care:  Yes (Comment)  Care giving concerns:  [facility]. Patient has moved around a lot int the past month. [place] From the hospital patient went to SNF and dc's to [facility] from SNF. Patient has become increasingly confused.      Social Worker assessment / plan:  LCSW following  for disposition.  Patient admitted for UTI.   LCSW spoke with daughters Jamie SandyBeth and Jamie Le by phone. Patient was unable to participate in assessment due to confusion.   Patient from [facility]. Daughter's report that patient has become increasingly confused. Family believes that the frequent moves and hospitalizations have confused patient.   Prior to fall at home daughter, Jamie AspCindy, reports that patient was independent in ADLs and was driving. Daughter reports thay patient has been using a wheel chair  since she left SNF. However Jamie AspCindy states that patient can transfer and walk short distances. Daughter reports that ALF have seen patient ambulate independently.   Patient has been at ALF for 2 weeks. Previously in SNF for rehab.   Patient and family prefer [facility] over SNF.  PLAN: TBD   Employment status:  Retired Database administratornsurance information:  Managed Medicare PT Recommendations:  Skilled Nursing Facility Information / Referral to community resources:     Patient/Family's Response to care:  Family expressed concerns about patient becoming more confused and being non compliant with SNF. Family prefers patient return to ALF. Family thankful for LCSW services.   Patient/Family's Understanding of and Emotional Response to Diagnosis, Current Treatment, and Prognosis:  Family is understanding of current diagnosis, but prefers AFL over SNF recommendation.   Emotional Assessment Appearance:  Appears older than stated age Attitude/Demeanor/Rapport:  Unable to Assess Affect (typically observed):  Unable to Assess Orientation:  Oriented to Self, Oriented to Place, Oriented to Situation Alcohol / Substance use:  Not Applicable Psych involvement (Current and /or in the community):  No (Comment)  Discharge Needs  Concerns to be addressed:  No discharge needs identified Readmission within the last 30 days:  No Current discharge risk:  None Barriers to Discharge:  No SNF bed, Continued Medical Work up   BJ'sBernette Shrihan Putt, LCSW 07/27/2017, 12:07 PM

## 2017-07-27 NOTE — Progress Notes (Signed)
Patient was much more alert and oriented this morning when compared to yesterday. She has however become increasingly confused again as today has gone by. Jamie Le has assisted this RN in keeping Satsumaindy, patients daughter, updated about plan of care for patient.

## 2017-07-27 NOTE — Progress Notes (Signed)
LCSW following for SNF placement.  Patient is from [facility]. Family prefers that patient return to [facility] with home health pt.  Patient has been moved frequently and they believe it is causing her to be more confused.   LCSW will send documents to facility to see if they can meet PT recs.   LCSW will continue to follow.    Beulah GandyBernette Lyah Millirons, LSCW [facility] CSW [phone]

## 2017-07-27 NOTE — Progress Notes (Signed)
PROGRESS NOTE  Jamie KindShirley Panther AOZ:[id number] DOB: 02/22/1930 DOA: 07/24/2017 PCP: Manus GunningMazurek, Maggie, FNP  HPI/Recap of past 24 hours: Jamie Le is a 83 y.o. female with medical history significant of EtOH abuse, dementia, PAF.  Patient is a resident of assisted living. Presents to ED s/p Fall.  3rd fall this past month.  No loss of consciousness. Per previous report more confused recently and 3 falls in past 3 days complicated by poor appetite and poor oral intake in the past 2 days.  No acute events overnight. Patient is more aklert today. Denies any pain, nausea, or dysuria.  Assessment/Plan: Principal Problem:   UTI (urinary tract infection) Active Problems:   Acute encephalopathy   ETOH abuse   AKI (acute kidney injury) (HCC)   Pressure injury of skin  Acute metabolic encephalopathy most likely 2/2 UTI, poa -Unclear baseline -Unable to reach family -Writer called family 07/25/17 and left message to be called back -IV rocephin day #3  Klebsiella oxytoca UTI, poa -urine cx >100,000 klebsiella oxytoca -IV rocephin day # 3 -afebrile no leukocytosis -discharge on ciprofloxacin 500 mg BID x 7 days.  Etoh abuse with concern for withdrawal -CIWA protocol -vital signs stable  AKI, resolved -prerenal from dehydration -cr 0.69 from 0.78 from 1.20 -resume lisinopril and spironolactone -c/w IV fluid hydration  Hyponatremia -no baseline available -Na+ 129 2 days -Na+ 131 -Repeat BMP  Ambulatory dysfunction -PT evaluated and recommended SNF -fall precaution when stable -multiple falls recently  Normocytic anemia -hg 10.4, mcv 91.6 -baseline hg 11 -no overt sign of bleeding -CBC am  HTN BP is stable Continue home meds  Anxiety/depression Continue home meds   Code Status: Full  Family Communication: No family member at bedside  Disposition Plan: will stay another midnight to continue IV antibiotics   Consultants:  None  Procedures:  None    Antimicrobials:  IV  DVT prophylaxis:  sq lovenox 40 mg daily   Objective: Vitals:   07/26/17 0514 07/26/17 1436 07/26/17 2212 07/27/17 0617  BP: (!) 152/76 124/81 135/80 (!) 145/96  Pulse: 82 85 88 84  Resp: 16 18 17 20   Temp: 98.8 F (37.1 C) 98.6 F (37 C) 97.6 F (36.4 C) (!) 97.5 F (36.4 C)  TempSrc: Oral Oral Oral Oral  SpO2: 97% 98% 97% 95%  Weight:      Height:        Intake/Output Summary (Last 24 hours) at 07/27/2017 1037 Last data filed at 07/27/2017 0914 Gross per 24 hour  Intake 410 ml  Output -  Net 410 ml   Filed Weights   07/25/17 0245  Weight: 59 kg (130 lb 1.1 oz)    Exam:  Constitutional: NAD, calm, comfortable Eyes: PERRL, lids and conjunctivae normal ENMT: Mucous membranes are moist. Posterior pharynx clear of any exudate or lesions.Normal dentition.  Neck: normal, supple, no masses, no thyromegaly Respiratory: clear to auscultation bilaterally, no wheezing, no crackles. Normal respiratory effort. No accessory muscle use.  Cardiovascular: Regular rate and rhythm, no murmurs / rubs / gallops. No extremity edema. 2+ pedal pulses. No carotid bruits.  Abdomen: no tenderness, no masses palpated. No hepatosplenomegaly. Bowel sounds positive.  Musculoskeletal: no clubbing / cyanosis. No joint deformity upper and lower extremities. Good ROM, no contractures. Normal muscle tone.  Skin: no rashes, lesions, ulcers. No induration Neurologic: CN 2-12 grossly intact. Sensation intact, DTR normal. Strength 5/5 in all 4.  Psych: unable to assess due to AMS   Data Reviewed: CBC: Recent Labs  Lab 07/24/17 2053 07/25/17 0504 07/26/17 0435  WBC 9.4 7.7 5.7  NEUTROABS 7.7  --   --   HGB 11.5* 10.4* 10.6*  HCT 34.6* 31.7* 32.4*  MCV 92.0 91.6 91.8  PLT 340 324 312   Basic Metabolic Panel: Recent Labs  Lab 07/24/17 2053 07/25/17 0504 07/26/17 0435  NA 129* 131* 131*  K 5.0 4.1 4.1  CL 95* 100* 102  CO2 23 22 20*  GLUCOSE 106* 93 88  BUN  26* 19 12  CREATININE 1.20* 0.78 0.69  CALCIUM 9.8 8.7* 8.6*   GFR: Estimated Creatinine Clearance: 47.9 mL/min (by C-G formula based on SCr of 0.69 mg/dL). Liver Function Tests: Recent Labs  Lab 07/24/17 2053  AST 19  ALT 18  ALKPHOS 77  BILITOT 1.0  PROT 7.0  ALBUMIN 3.4*   No results for input(s): LIPASE, AMYLASE in the last 168 hours. No results for input(s): AMMONIA in the last 168 hours. Coagulation Profile: No results for input(s): INR, PROTIME in the last 168 hours. Cardiac Enzymes: Recent Labs  Lab 07/24/17 2102  TROPONINI 0.06*   BNP (last 3 results) No results for input(s): PROBNP in the last 8760 hours. HbA1C: No results for input(s): HGBA1C in the last 72 hours. CBG: No results for input(s): GLUCAP in the last 168 hours. Lipid Profile: No results for input(s): CHOL, HDL, LDLCALC, TRIG, CHOLHDL, LDLDIRECT in the last 72 hours. Thyroid Function Tests: No results for input(s): TSH, T4TOTAL, FREET4, T3FREE, THYROIDAB in the last 72 hours. Anemia Panel: No results for input(s): VITAMINB12, FOLATE, FERRITIN, TIBC, IRON, RETICCTPCT in the last 72 hours. Urine analysis:    Component Value Date/Time   COLORURINE YELLOW 07/24/2017 2053   APPEARANCEUR CLOUDY (A) 07/24/2017 2053   LABSPEC 1.011 07/24/2017 2053   PHURINE 5.0 07/24/2017 2053   GLUCOSEU NEGATIVE 07/24/2017 2053   HGBUR SMALL (A) 07/24/2017 2053   BILIRUBINUR NEGATIVE 07/24/2017 2053   KETONESUR 5 (A) 07/24/2017 2053   PROTEINUR NEGATIVE 07/24/2017 2053   NITRITE POSITIVE (A) 07/24/2017 2053   LEUKOCYTESUR MODERATE (A) 07/24/2017 2053   Sepsis Labs: @LABRCNTIP (procalcitonin:4,lacticidven:4)  ) Recent Results (from the past 240 hour(s))  Urine culture     Status: Abnormal   Collection Time: 07/24/17 10:40 PM  Result Value Ref Range Status   Specimen Description URINE, CLEAN CATCH  Final   Special Requests Normal  Final   Culture >=100,000 COLONIES/mL KLEBSIELLA OXYTOCA (A)  Final   Report  Status 07/27/2017 FINAL  Final   Organism ID, Bacteria KLEBSIELLA OXYTOCA (A)  Final      Susceptibility   Klebsiella oxytoca - MIC*    AMPICILLIN >=32 RESISTANT Resistant     CEFAZOLIN 8 SENSITIVE Sensitive     CEFTRIAXONE <=1 SENSITIVE Sensitive     CIPROFLOXACIN <=0.25 SENSITIVE Sensitive     GENTAMICIN <=1 SENSITIVE Sensitive     IMIPENEM <=0.25 SENSITIVE Sensitive     NITROFURANTOIN 32 SENSITIVE Sensitive     TRIMETH/SULFA <=20 SENSITIVE Sensitive     AMPICILLIN/SULBACTAM 16 INTERMEDIATE Intermediate     PIP/TAZO <=4 SENSITIVE Sensitive     Extended ESBL NEGATIVE Sensitive     * >=100,000 COLONIES/mL KLEBSIELLA OXYTOCA      Studies: No results found.  Scheduled Meds: . calcium-vitamin D  0.5 tablet Oral Daily  . ciprofloxacin  500 mg Oral BID  . digoxin  0.125 mg Oral Daily  . DULoxetine  30 mg Oral Daily  . enoxaparin (LOVENOX) injection  40 mg Subcutaneous Q24H  .  folic acid  1 mg Oral Daily  . metoprolol succinate  50 mg Oral Daily  . multivitamin with minerals  1 tablet Oral Daily  . pantoprazole  40 mg Oral Daily  . sertraline  25 mg Oral QHS  . thiamine  100 mg Oral Daily   Or  . thiamine  100 mg Intravenous Daily    Continuous Infusions:    LOS: 0 days     Darlin Droparole N Mekenna Finau, MD [facility] Pager [phone]  If 7PM-7AM, please contact night-coverage [email or web address] Password [email or web address] 07/27/2017, 10:37 AM

## 2017-07-28 DIAGNOSIS — F1010 Alcohol abuse, uncomplicated: Secondary | ICD-10-CM | POA: Diagnosis not present

## 2017-07-28 DIAGNOSIS — N390 Urinary tract infection, site not specified: Secondary | ICD-10-CM | POA: Diagnosis not present

## 2017-07-28 DIAGNOSIS — N179 Acute kidney failure, unspecified: Secondary | ICD-10-CM | POA: Diagnosis not present

## 2017-07-28 DIAGNOSIS — G9340 Encephalopathy, unspecified: Secondary | ICD-10-CM | POA: Diagnosis not present

## 2017-07-28 MED ORDER — MUPIROCIN 2 % EX OINT
1.0000 "application " | TOPICAL_OINTMENT | Freq: Two times a day (BID) | CUTANEOUS | Status: DC
  Administered 2017-07-28: 1 via NASAL
  Filled 2017-07-28: qty 22

## 2017-07-28 MED ORDER — CEFUROXIME AXETIL 500 MG PO TABS
500.0000 mg | ORAL_TABLET | Freq: Two times a day (BID) | ORAL | Status: DC
  Administered 2017-07-28: 500 mg via ORAL
  Filled 2017-07-28 (×3): qty 1

## 2017-07-28 MED ORDER — CEFUROXIME AXETIL 500 MG PO TABS: 500.0000 mg | ORAL_TABLET | Freq: Two times a day (BID) | ORAL | 0 refills | Status: DC

## 2017-07-28 MED ORDER — CHLORHEXIDINE GLUCONATE CLOTH 2 % EX PADS: 6.0000 | MEDICATED_PAD | Freq: Every day | CUTANEOUS | Status: DC

## 2017-07-28 NOTE — Progress Notes (Addendum)
LCSW following for disposition.  Patient will return to [facility], [facility]F.  Facility will set up home health if orders are sent with patient. LCSW contacted facility to confirm.   Family refuses SNF and will work on getting patient supervision at the facility.    Jamie GandyBernette Jakeira Le, LSCW [facility] CSW [phone]

## 2017-07-28 NOTE — Progress Notes (Signed)
Per pharmacy do not give 12pm dose of Ceftin d/t cCipro was given this morning.

## 2017-07-28 NOTE — Progress Notes (Signed)
Date: July 28, 2017 Chart review for discharge needs:  None found for case management. Patient has no questions concerning post hospital care. 

## 2017-07-28 NOTE — Progress Notes (Signed)
Patient returning to [facility]F. Facility aware of discharge and confirmed patient's ability to return. CSW sent clinical documents, staff confirmed receipt. PTAR contacted, patient's family notified. Patient's RN can call report to [phone], packet complete. CSW signing off, no other needs identified.   Celso SickleKimberly Jhovany Weidinger, [place]CS[place] Clinical Social Worker [facility] Cell#: [phone]

## 2017-07-28 NOTE — Discharge Summary (Signed)
Physician Discharge Summary  Meshawn Oconnor ZOX:[id number] DOB: 09-06-1929 DOA: 07/24/2017  PCP: Manus Gunning, FNP  Admit date: 07/24/2017 Discharge date: 07/28/2017  Admitted From: ALF Disposition:  ALF  Recommendations for Outpatient Follow-up:  1. Follow up with PCP in 1-2 weeks 2. Please obtain BMP/CBC in one week   Home Health: YES Equipment/Devices: PT  Discharge Condition: Stable CODE STATUS: Diet recommendation: Heart Healthy    Brief/Interim Summary: 83 y.o.femalewith medical history significant ofEtOH abuse, dementia, PAF. Patient is a resident of assisted living. Presents to ED s/p Fall. 3rd fall this past month. No loss of consciousness. Per previous report more confused recently and 3 falls in past 3 days complicated bypoor appetite and poor oral intake in the past 2 days. pt was started on IV ceftriaxone with clinical improvement. PT recommended SNF but family wanted pt to go back to [facility].  PT will be set up until family arranges for extra help    Discharge Diagnoses:   Acute metabolic encephalopathy most likely 2/2 UTI, poa -Back to baseline family on day of d/c -IV rocephin>>cipro -d/c with 2 more days cefuroxime to complete one week of tx  Klebsiella oxytoca UTI, poa -urine cx >100,000 klebsiella oxytoca -IV rocephin day # 3 -afebrile no leukocytosis -discharge on ciprofloxacin500 mg BID x7 days.  Etoh abuse with concern for withdrawal -CIWA protocol -vital signs stable -no signs of withdrawal  AKI, resolved -prerenal from dehydration -cr0.69 at baseline -serum creatinine peaked 1.20 -resumelisinopril and spironolactone -c/w IV fluid hydration-->resolved  Hyponatremia -no baseline available -due to volume depletion and poor solute intake -stable/improved  Ambulatory dysfunction -PT evaluated and recommended SNF--however, family wanted to take the patient back to [facility] for physical therapy. -fall  precaution when stable -multiple falls recently  Normocytic anemia -hg 10.4, mcv 91.6 -baseline hg 11 -no overt sign of bleeding -CBC am  HTN BP is stable Continue home meds  Anxiety/depression Continue home meds      Discharge Instructions  Discharge Instructions    Diet general   Complete by:  As directed    Increase activity slowly   Complete by:  As directed      Allergies as of 07/28/2017   No Known Allergies     Medication List    STOP taking these medications   ALPRAZolam 0.5 MG tablet Commonly known as:  XANAX   traMADol 50 MG tablet Commonly known as:  ULTRAM     TAKE these medications   albuterol (2.5 MG/3ML) 0.083% nebulizer solution Commonly known as:  PROVENTIL Take 2.5 mg by nebulization every 6 (six) hours as needed for wheezing or shortness of breath.   CALCIUM 600-D 600-400 MG-UNIT Tabs Generic drug:  Calcium Carbonate-Vitamin D3 Take 0.5 tablets by mouth daily.   cefUROXime 500 MG tablet Commonly known as:  CEFTIN Take 1 tablet (500 mg total) by mouth 2 (two) times daily with a meal.   digoxin 0.125 MG tablet Commonly known as:  LANOXIN Take 0.125 mg by mouth daily.   docusate sodium 100 MG capsule Commonly known as:  COLACE Take 100 mg by mouth daily as needed for mild constipation.   DULoxetine 30 MG capsule Commonly known as:  CYMBALTA Take 30 mg by mouth daily.   lisinopril 10 MG tablet Commonly known as:  PRINIVIL,ZESTRIL Take 10 mg by mouth daily.   meclizine 12.5 MG tablet Commonly known as:  ANTIVERT Take 12.5 mg by mouth 3 (three) times daily as needed for dizziness.   Melatonin 3 MG  Tabs Take 3 mg by mouth daily.   Metoprolol Succinate 50 MG Cs24 Take 50 mg by mouth daily.   omeprazole 20 MG capsule Commonly known as:  PRILOSEC Take 20 mg by mouth daily.   sertraline 25 MG tablet Commonly known as:  ZOLOFT Take 25 mg by mouth at bedtime.   spironolactone 25 MG tablet Commonly known as:   ALDACTONE Take 25 mg by mouth 2 (two) times daily.      Follow-up Information    Manus Gunning, FNP Follow up.   Specialty:  Nurse Practitioner Contact information: [address] [place] [phone]          No Known Allergies  Consultations:  none   Procedures/Studies: Dg Chest 2 View  Result Date: 07/24/2017 CLINICAL DATA:  Weakness, falls EXAM: CHEST  2 VIEW COMPARISON:  None. FINDINGS: No acute pulmonary infiltrate or effusion. Borderline to mild cardiomegaly. Aortic atherosclerosis. No pneumothorax. Postsurgical changes in the right axillary region. Minimal convex opacity at the right cardio phrenic sulcus, could relate to possible hiatal hernia. IMPRESSION: 1. Negative for acute infiltrate or edema 2. Borderline to mild cardiomegaly. 3. Convex opacity at the right cardiophrenic sulcus, possible hiatal hernia, short interval radiographic follow-up recommended. Electronically Signed   By: Jasmine Pang M.D.   On: 07/24/2017 21:26   Ct Head Wo Contrast  Result Date: 07/24/2017 CLINICAL DATA:  Headache EXAM: CT HEAD WITHOUT CONTRAST TECHNIQUE: Contiguous axial images were obtained from the base of the skull through the vertex without intravenous contrast. COMPARISON:  None. FINDINGS: Brain: No acute territorial infarction, hemorrhage or intracranial mass is visualized. Extensive atrophy. Mild to moderate small vessel ischemic changes of the white matter. Prominent ventricles felt secondary to atrophy. Vascular: No hyperdense vessels.  Carotid artery calcification. Skull: No fracture Sinuses/Orbits: No acute finding. Other: None IMPRESSION: 1. No CT evidence for acute intracranial abnormality. 2. Atrophy and small vessel ischemic changes of the white matter. Electronically Signed   By: Jasmine Pang M.D.   On: 07/24/2017 21:20        Discharge Exam: Vitals:   07/27/17 2111 07/28/17 0438  BP:  140/76  Pulse: 83 80  Resp: 20 20  Temp: 97.7 F (36.5  C) 97.8 F (36.6 C)  SpO2: 97% 97%   Vitals:   07/27/17 0617 07/27/17 1354 07/27/17 2111 07/28/17 0438  BP: (!) 145/96 107/67 131/74 140/76  Pulse: 84 90 83 80  Resp: 20 20 20 20   Temp: (!) 97.5 F (36.4 C) (!) 97.5 F (36.4 C) 97.7 F (36.5 C) 97.8 F (36.6 C)  TempSrc: Oral Oral Oral Oral  SpO2: 95% 99% 97% 97%  Weight:      Height:        General: Pt is alert, awake, not in acute distress Cardiovascular: RRR, S1/S2 +, no rubs, no gallops Respiratory: CTA bilaterally, no wheezing, no rhonchi Abdominal: Soft, NT, ND, bowel sounds + Extremities: no edema, no cyanosis   The results of significant diagnostics from this hospitalization (including imaging, microbiology, ancillary and laboratory) are listed below for reference.    Significant Diagnostic Studies: Dg Chest 2 View  Result Date: 07/24/2017 CLINICAL DATA:  Weakness, falls EXAM: CHEST  2 VIEW COMPARISON:  None. FINDINGS: No acute pulmonary infiltrate or effusion. Borderline to mild cardiomegaly. Aortic atherosclerosis. No pneumothorax. Postsurgical changes in the right axillary region. Minimal convex opacity at the right cardio phrenic sulcus, could relate to possible hiatal hernia. IMPRESSION: 1. Negative for acute infiltrate or edema  2. Borderline to mild cardiomegaly. 3. Convex opacity at the right cardiophrenic sulcus, possible hiatal hernia, short interval radiographic follow-up recommended. Electronically Signed   By: Jasmine PangKim  Fujinaga M.D.   On: 07/24/2017 21:26   Ct Head Wo Contrast  Result Date: 07/24/2017 CLINICAL DATA:  Headache EXAM: CT HEAD WITHOUT CONTRAST TECHNIQUE: Contiguous axial images were obtained from the base of the skull through the vertex without intravenous contrast. COMPARISON:  None. FINDINGS: Brain: No acute territorial infarction, hemorrhage or intracranial mass is visualized. Extensive atrophy. Mild to moderate small vessel ischemic changes of the white matter. Prominent ventricles felt  secondary to atrophy. Vascular: No hyperdense vessels.  Carotid artery calcification. Skull: No fracture Sinuses/Orbits: No acute finding. Other: None IMPRESSION: 1. No CT evidence for acute intracranial abnormality. 2. Atrophy and small vessel ischemic changes of the white matter. Electronically Signed   By: Jasmine PangKim  Fujinaga M.D.   On: 07/24/2017 21:20     Microbiology: Recent Results (from the past 240 hour(s))  Urine culture     Status: Abnormal   Collection Time: 07/24/17 10:40 PM  Result Value Ref Range Status   Specimen Description URINE, CLEAN CATCH  Final   Special Requests Normal  Final   Culture >=100,000 COLONIES/mL KLEBSIELLA OXYTOCA (A)  Final   Report Status 07/27/2017 FINAL  Final   Organism ID, Bacteria KLEBSIELLA OXYTOCA (A)  Final      Susceptibility   Klebsiella oxytoca - MIC*    AMPICILLIN >=32 RESISTANT Resistant     CEFAZOLIN 8 SENSITIVE Sensitive     CEFTRIAXONE <=1 SENSITIVE Sensitive     CIPROFLOXACIN <=0.25 SENSITIVE Sensitive     GENTAMICIN <=1 SENSITIVE Sensitive     IMIPENEM <=0.25 SENSITIVE Sensitive     NITROFURANTOIN 32 SENSITIVE Sensitive     TRIMETH/SULFA <=20 SENSITIVE Sensitive     AMPICILLIN/SULBACTAM 16 INTERMEDIATE Intermediate     PIP/TAZO <=4 SENSITIVE Sensitive     Extended ESBL NEGATIVE Sensitive     * >=100,000 COLONIES/mL KLEBSIELLA OXYTOCA  MRSA PCR Screening     Status: Abnormal   Collection Time: 07/27/17  5:20 PM  Result Value Ref Range Status   MRSA by PCR POSITIVE (A) NEGATIVE Final    Comment:        The GeneXpert MRSA Assay (FDA approved for NASAL specimens only), is one component of a comprehensive MRSA colonization surveillance program. It is not intended to diagnose MRSA infection nor to guide or monitor treatment for MRSA infections. RESULT CALLED TO, READ BACK BY AND VERIFIED WITH: JACKSON,V RN 12.27.18  @2122  ZANDO,C      Labs: Basic Metabolic Panel: Recent Labs  Lab 07/24/17 2053 07/25/17 0504 07/26/17 0435    NA 129* 131* 131*  K 5.0 4.1 4.1  CL 95* 100* 102  CO2 23 22 20*  GLUCOSE 106* 93 88  BUN 26* 19 12  CREATININE 1.20* 0.78 0.69  CALCIUM 9.8 8.7* 8.6*   Liver Function Tests: Recent Labs  Lab 07/24/17 2053  AST 19  ALT 18  ALKPHOS 77  BILITOT 1.0  PROT 7.0  ALBUMIN 3.4*   No results for input(s): LIPASE, AMYLASE in the last 168 hours. No results for input(s): AMMONIA in the last 168 hours. CBC: Recent Labs  Lab 07/24/17 2053 07/25/17 0504 07/26/17 0435  WBC 9.4 7.7 5.7  NEUTROABS 7.7  --   --   HGB 11.5* 10.4* 10.6*  HCT 34.6* 31.7* 32.4*  MCV 92.0 91.6 91.8  PLT 340 324 312  Cardiac Enzymes: Recent Labs  Lab 07/24/17 2102  TROPONINI 0.06*   BNP: Invalid input(s): POCBNP CBG: No results for input(s): GLUCAP in the last 168 hours.  Time coordinating discharge:  Greater than 30 minutes  Signed:  Catarina Hartshornavid Alainah Phang, DO [facility] Pager: [phone] 07/28/2017, 11:38 AM

## 2017-07-28 NOTE — Progress Notes (Signed)
Pt picked up by [facility] for transport to [facility]. Daughter is aware. Pt had no complaints at this time.

## 2017-07-28 NOTE — NC FL2 (Signed)
[name]     IDENTIFICATION  Patient Name: Jamie Le Birthdate: 03/19/1930 Sex: female Admission Date (Current Location): 07/24/2017  [facility] [place]   Fac[facility] and Address:  [facility],  [address], [place] [id number]      Provider Number: [id number]  Attending Physician Name and Address:  Catarina Hartshornat, David, MD  Relative Name and Phone Number:       Current Level of Care: Hospital Recommended Level of Care: Assisted Living Facility Prior Approval Number:    Date Approved/Denied:   PASRR Number:    Discharge Plan: Other (Comment)(AFL)    Current Diagnoses: Patient Active Problem List   Diagnosis Date Noted  . Lower urinary tract infectious disease   . Pressure injury of skin 07/25/2017  . UTI (urinary tract infection) 07/24/2017  . Acute encephalopathy 07/24/2017  . ETOH abuse 07/24/2017  . AKI (acute kidney injury) (HCC) 07/24/2017    Orientation RESPIRATION BLADDER Height & Weight     Self, Time, Situation, Place  Normal Continent Weight: 130 lb 1.1 oz (59 kg) Height:  5\' 6"  (167.6 cm)  BEHAVIORAL SYMPTOMS/MOOD NEUROLOGICAL BOWEL NUTRITION STATUS      Continent Diet regular   AMBULATORY STATUS COMMUNICATION OF NEEDS Skin   Supervision Verbally Skin abrasions, Bruising                       Personal Care Assistance Level of Assistance  Bathing, Feeding, Dressing Bathing Assistance: Limited assistance Feeding assistance: Independent Dressing Assistance: Limited assistance     Functional Limitations Info  Sight, Hearing, Speech Sight Info: Adequate Hearing Info: Adequate Speech Info: Adequate    SPECIAL CARE FACTORS FREQUENCY                       Contractures Contractures Info: Not present    Additional Factors Info  Code Status, Allergies, Isolation Precautions Code Status Info: Full Allergies Info: NKA     Isolation Precautions Info: MRSA         Discharge Medications: STOP taking these medications   ALPRAZolam 0.5 MG tablet Commonly known as:  XANAX   traMADol 50 MG tablet Commonly known as:  ULTRAM     TAKE these medications   albuterol (2.5 MG/3ML) 0.083% nebulizer solution Commonly known as:  PROVENTIL Take 2.5 mg by nebulization every 6 (six) hours as needed for wheezing or shortness of breath.   CALCIUM 600-D 600-400 MG-UNIT Tabs Generic drug:  Calcium Carbonate-Vitamin D3 Take 0.5 tablets by mouth daily.   cefUROXime 500 MG tablet Commonly known as:  CEFTIN Take 1 tablet (500 mg total) by mouth 2 (two) times daily with a meal.   digoxin 0.125 MG tablet Commonly known as:  LANOXIN Take 0.125 mg by mouth daily.   docusate sodium 100 MG capsule Commonly known as:  COLACE Take 100 mg by mouth daily as needed for mild constipation.   DULoxetine 30 MG capsule Commonly known as:  CYMBALTA Take 30 mg by mouth daily.   lisinopril 10 MG tablet Commonly known as:  PRINIVIL,ZESTRIL Take 10 mg by mouth daily.   meclizine 12.5 MG tablet Commonly known as:  ANTIVERT Take 12.5 mg by mouth 3 (three) times daily as needed for dizziness.   Melatonin 3 MG Tabs Take 3 mg by mouth daily.   Metoprolol Succinate 50 MG Cs24 Take 50 mg by mouth daily.   omeprazole 20 MG capsule  Commonly known as:  PRILOSEC Take 20 mg by mouth daily.   sertraline 25 MG tablet Commonly known as:  ZOLOFT Take 25 mg by mouth at bedtime.   spironolactone 25 MG tablet Commonly known as:  ALDACTONE Take 25 mg by mouth 2 (two) times daily.       Relevant Imaging Results:  Relevant Lab Results:   Additional Information ssn: [id number]  Coralyn HellingBernette Jones, LCSW

## 2017-07-28 NOTE — Progress Notes (Signed)
[facility]  All questions answered.

## 2017-09-01 ENCOUNTER — Encounter (HOSPITAL_COMMUNITY): Payer: Self-pay

## 2017-09-01 ENCOUNTER — Emergency Department (HOSPITAL_COMMUNITY): Payer: Medicare Other

## 2017-09-01 ENCOUNTER — Emergency Department (HOSPITAL_COMMUNITY)
Admission: EM | Admit: 2017-09-01 | Discharge: 2017-09-01 | Disposition: A | Discharge status: Skilled Nursing Facility | Payer: Medicare Other | Attending: Emergency Medicine | Admitting: Emergency Medicine

## 2017-09-01 DIAGNOSIS — S6981XA Other specified injuries of right wrist, hand and finger(s), initial encounter: Secondary | ICD-10-CM | POA: Insufficient documentation

## 2017-09-01 DIAGNOSIS — Y92009 Unspecified place in unspecified non-institutional (private) residence as the place of occurrence of the external cause: Secondary | ICD-10-CM

## 2017-09-01 DIAGNOSIS — S61511A Laceration without foreign body of right wrist, initial encounter: Secondary | ICD-10-CM

## 2017-09-01 DIAGNOSIS — Z79899 Other long term (current) drug therapy: Secondary | ICD-10-CM | POA: Insufficient documentation

## 2017-09-01 DIAGNOSIS — Y939 Activity, unspecified: Secondary | ICD-10-CM | POA: Diagnosis not present

## 2017-09-01 DIAGNOSIS — Y92199 Unspecified place in other specified residential institution as the place of occurrence of the external cause: Secondary | ICD-10-CM | POA: Diagnosis not present

## 2017-09-01 DIAGNOSIS — S0003XA Contusion of scalp, initial encounter: Secondary | ICD-10-CM | POA: Diagnosis not present

## 2017-09-01 DIAGNOSIS — W19XXXA Unspecified fall, initial encounter: Secondary | ICD-10-CM | POA: Insufficient documentation

## 2017-09-01 DIAGNOSIS — I509 Heart failure, unspecified: Secondary | ICD-10-CM | POA: Insufficient documentation

## 2017-09-01 DIAGNOSIS — Y999 Unspecified external cause status: Secondary | ICD-10-CM | POA: Diagnosis not present

## 2017-09-01 DIAGNOSIS — S0990XA Unspecified injury of head, initial encounter: Secondary | ICD-10-CM | POA: Diagnosis present

## 2017-09-01 HISTORY — DX: Unspecified dementia, unspecified severity, without behavioral disturbance, psychotic disturbance, mood disturbance, and anxiety: F03.90

## 2017-09-01 NOTE — ED Notes (Signed)
Bed: RESB Expected date:  Expected time:  Means of arrival:  Comments: Fall, hematoma

## 2017-09-01 NOTE — ED Provider Notes (Signed)
[facility]-EMERGENCY DEPT Provider Note: Jamie Dell, MD, FACEP  CSN: [id number] MRN: [id number] ARRIVAL: 09/01/17 at 0457 ROOM: RESB/RESB   CHIEF COMPLAINT  Fall  Level 5 caveat: Dementia HISTORY OF PRESENT ILLNESS  09/01/17 5:41 AM Jamie Le is a 84 y.o. female from an assisted living facility who apparently had an unwitnessed fall this morning.  She has a large hematoma to her left occiput.  She is at her baseline mentation per staff.  She denies neck pain.  She does have a skin tear to her right wrist.  The patient tells me she was in her daughter's kitchen and fell hitting her head against a cabinet.  This may be confabulation given the patient's history of dementia and known residency in an assisted living facility.   Past Medical History:  Diagnosis Date  . A-fib (HCC)   . Acidosis   . Alcohol withdrawal (HCC)   . Alcoholism (HCC)   . Atrial flutter (HCC)   . CHF (congestive heart failure) (HCC)   . Dementia     History reviewed. No pertinent surgical history.  No family history on file.  Social History   Tobacco Use  . Smoking status: Unknown If Ever Smoked  . Smokeless tobacco: Never Used  Substance Use Topics  . Alcohol use: Yes    Comment: Moderate use  . Drug use: No    Prior to Admission medications   Medication Sig Start Date End Date Taking? Authorizing Provider  Calcium Carbonate-Vitamin D3 (CALCIUM 600-D) 600-400 MG-UNIT TABS Take 0.5 tablets by mouth daily.   Yes [provider]  digoxin (LANOXIN) 0.125 MG tablet Take 0.125 mg by mouth daily.   Yes [provider]  docusate sodium (COLACE) 100 MG capsule Take 100 mg by mouth daily as needed for mild constipation.   Yes [provider]  DULoxetine (CYMBALTA) 30 MG capsule Take 60 mg by mouth daily.    Yes [provider]  lisinopril (PRINIVIL,ZESTRIL) 10 MG tablet Take 10 mg by mouth daily.   Yes [provider]  meclizine (ANTIVERT) 12.5 MG tablet Take  12.5 mg by mouth 3 (three) times daily as needed for dizziness.   Yes [provider]  Melatonin 3 MG TABS Take 3 mg by mouth daily.   Yes [provider]  Metoprolol Succinate 50 MG CS24 Take 50 mg by mouth daily.   Yes [provider]  omeprazole (PRILOSEC) 20 MG capsule Take 20 mg by mouth daily.   Yes [provider]  spironolactone (ALDACTONE) 25 MG tablet Take 25 mg by mouth 2 (two) times daily.   Yes [provider]  cefUROXime (CEFTIN) 500 MG tablet Take 1 tablet (500 mg total) by mouth 2 (two) times daily with a meal. Patient not taking: Reported on 09/01/2017 07/28/17   Catarina Hartshorn, MD    Allergies Patient has no known allergies.   REVIEW OF SYSTEMS  Negative except as noted here or in the History of Present Illness.   PHYSICAL EXAMINATION  Initial Vital Signs Blood pressure (!) 159/114, pulse 70, temperature 97.6 F (36.4 C), temperature source Oral, resp. rate (!) 22, SpO2 100 %.  Examination General: Well-developed, well-nourished female in no acute distress; appearance consistent with age of record HENT: normocephalic; large hematoma left occiput; left TM obscured by cerumen; no right hemotympanum Eyes: Right pupil round and reactive to light; left pupil irregular status post surgery; lens implants Neck: supple Heart: regular rate and rhythm; distant sounds Lungs: clear to  auscultation bilaterally Abdomen: soft; nondistended; nontender; no masses or hepatosplenomegaly; bowel sounds present Extremities: No deformity; no pain on passive range of motion; subacute skin tear right dorsal forearm Neurologic: Awake, alert; motor function intact in all extremities and symmetric; no facial droop Skin: Warm and dry Psychiatric: Normal mood and affect   RESULTS  Summary of this visit's results, reviewed by myself:   EKG Interpretation  Date/Time:    Ventricular Rate:    PR Interval:    QRS Duration:   QT Interval:    QTC  Calculation:   R Axis:     Text Interpretation:        Laboratory Studies: No results found for this or any previous visit (from the past 24 hour(s)). Imaging Studies: Ct Head Wo Contrast  Result Date: 09/01/2017 CLINICAL DATA:  84 y/o  F; fall with head injury. EXAM: CT HEAD WITHOUT CONTRAST TECHNIQUE: Contiguous axial images were obtained from the base of the skull through the vertex without intravenous contrast. COMPARISON:  07/24/2017 CT head FINDINGS: Brain: No evidence of acute infarction, hemorrhage, hydrocephalus, extra-axial collection or mass lesion/mass effect. Stable chronic microvascular ischemic changes and parenchymal volume loss of the brain. Vascular: Mild calcific atherosclerosis of carotid siphons. No hyperdense vessel identified. Skull: Left parietal region scalp contusion.  No calvarial fracture. Sinuses/Orbits: No acute finding. Other: Bilateral intra-ocular lens replacement. IMPRESSION: 1. Left parietal region scalp contusion. 2. No acute intracranial abnormality or calvarial fracture. 3. Stable chronic microvascular ischemic changes and parenchymal volume loss of the brain. Electronically Signed   By: Mitzi HansenLance  Furusawa-Stratton M.D.   On: 09/01/2017 [facility]06:02    ED COURSE  Nursing notes and initial vitals signs, including pulse oximetry, reviewed.  Vitals:   09/01/17 0518  BP: (!) 159/114  Pulse: 70  Resp: (!) 22  Temp: 97.6 F (36.4 C)  TempSrc: Oral  SpO2: 100%    PROCEDURES    ED DIAGNOSES     ICD-10-CM   1. Fall in home, initial encounter W19.XXXA    Y92.009   2. Scalp hematoma, initial encounter S00.03XA   3. Tear of skin of right wrist, initial encounter S61.511A        Paula LibraMolpus, Jezabel Lecker, MD 09/01/17 [phone]

## 2017-09-01 NOTE — ED Notes (Signed)
[facility] called for transportation back to [facility] and report given to Ad[place]

## 2017-09-01 NOTE — ED Triage Notes (Signed)
Pt arrived via [facility] from [facility] due to an unwitnessed fall. Pt states she hit her head on the wall, small golf ball sized hematoma upon assessment. Pt states she also landed on rt hip and is sore, no shortening or painful movement. Pt not currently on blood thinners. Pt hx dementia

## 2017-09-09 ENCOUNTER — Emergency Department (HOSPITAL_COMMUNITY): Payer: Medicare Other

## 2017-09-09 ENCOUNTER — Encounter (HOSPITAL_COMMUNITY): Payer: Self-pay | Admitting: Nurse Practitioner

## 2017-09-09 ENCOUNTER — Emergency Department (HOSPITAL_COMMUNITY)
Admission: EM | Admit: 2017-09-09 | Discharge: 2017-09-09 | Disposition: A | Discharge status: Skilled Nursing Facility | Payer: Medicare Other | Attending: Emergency Medicine | Admitting: Emergency Medicine

## 2017-09-09 DIAGNOSIS — Y92128 Other place in nursing home as the place of occurrence of the external cause: Secondary | ICD-10-CM | POA: Diagnosis not present

## 2017-09-09 DIAGNOSIS — I509 Heart failure, unspecified: Secondary | ICD-10-CM | POA: Insufficient documentation

## 2017-09-09 DIAGNOSIS — R52 Pain, unspecified: Secondary | ICD-10-CM

## 2017-09-09 DIAGNOSIS — W19XXXA Unspecified fall, initial encounter: Secondary | ICD-10-CM | POA: Insufficient documentation

## 2017-09-09 DIAGNOSIS — Y999 Unspecified external cause status: Secondary | ICD-10-CM | POA: Diagnosis not present

## 2017-09-09 DIAGNOSIS — F0390 Unspecified dementia without behavioral disturbance: Secondary | ICD-10-CM | POA: Diagnosis not present

## 2017-09-09 DIAGNOSIS — Y9389 Activity, other specified: Secondary | ICD-10-CM | POA: Insufficient documentation

## 2017-09-09 DIAGNOSIS — S0003XA Contusion of scalp, initial encounter: Secondary | ICD-10-CM | POA: Insufficient documentation

## 2017-09-09 DIAGNOSIS — M549 Dorsalgia, unspecified: Secondary | ICD-10-CM | POA: Diagnosis not present

## 2017-09-09 DIAGNOSIS — Z79899 Other long term (current) drug therapy: Secondary | ICD-10-CM | POA: Insufficient documentation

## 2017-09-09 DIAGNOSIS — S0990XA Unspecified injury of head, initial encounter: Secondary | ICD-10-CM | POA: Diagnosis present

## 2017-09-09 DIAGNOSIS — Y92129 Unspecified place in nursing home as the place of occurrence of the external cause: Secondary | ICD-10-CM

## 2017-09-09 NOTE — Discharge Instructions (Signed)
Radiology studies were done of her head, cervical spine, thoracic spine, lumbar spine, pelvis, and her right wrist.  There is no acute changes.  Use ice packs to the painful areas especially the bruising on her scalp where she hit her head.  She can have acetaminophen 500 mg every 8 hours for pain if it is okay with your facility physician.  She should be reevaluated if she has any problems listed on the head injury sheet information.

## 2017-09-09 NOTE — ED Notes (Signed)
[facility] staff was called and notified of pt's condition and discharge.

## 2017-09-09 NOTE — ED Notes (Signed)
[facility] was called for pt's transportation back to [facility] at [place]. 

## 2017-09-09 NOTE — ED Triage Notes (Signed)
Pt is presented from [facility] for a post unwitnessed fall evaluation, reportedly fell while trying to go to the bathroom, c/o head and mid thoracic back pain.
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
## 2017-09-24 ENCOUNTER — Emergency Department (HOSPITAL_COMMUNITY): Payer: Medicare Other

## 2017-09-24 ENCOUNTER — Emergency Department (HOSPITAL_COMMUNITY)
Admission: EM | Admit: 2017-09-24 | Discharge: 2017-09-24 | Disposition: A | Discharge status: Home / Self Care | Payer: Medicare Other | Attending: Emergency Medicine | Admitting: Emergency Medicine

## 2017-09-24 ENCOUNTER — Encounter (HOSPITAL_COMMUNITY): Payer: Self-pay

## 2017-09-24 DIAGNOSIS — W19XXXA Unspecified fall, initial encounter: Secondary | ICD-10-CM

## 2017-09-24 DIAGNOSIS — Y92129 Unspecified place in nursing home as the place of occurrence of the external cause: Secondary | ICD-10-CM | POA: Diagnosis not present

## 2017-09-24 DIAGNOSIS — Z79899 Other long term (current) drug therapy: Secondary | ICD-10-CM | POA: Insufficient documentation

## 2017-09-24 DIAGNOSIS — E860 Dehydration: Secondary | ICD-10-CM | POA: Diagnosis not present

## 2017-09-24 DIAGNOSIS — Y998 Other external cause status: Secondary | ICD-10-CM | POA: Diagnosis not present

## 2017-09-24 DIAGNOSIS — Y939 Activity, unspecified: Secondary | ICD-10-CM | POA: Insufficient documentation

## 2017-09-24 DIAGNOSIS — F0390 Unspecified dementia without behavioral disturbance: Secondary | ICD-10-CM | POA: Diagnosis not present

## 2017-09-24 DIAGNOSIS — I4891 Unspecified atrial fibrillation: Secondary | ICD-10-CM | POA: Insufficient documentation

## 2017-09-24 DIAGNOSIS — S0181XA Laceration without foreign body of other part of head, initial encounter: Secondary | ICD-10-CM

## 2017-09-24 DIAGNOSIS — N179 Acute kidney failure, unspecified: Secondary | ICD-10-CM | POA: Insufficient documentation

## 2017-09-24 DIAGNOSIS — I509 Heart failure, unspecified: Secondary | ICD-10-CM | POA: Diagnosis not present

## 2017-09-24 LAB — COMPREHENSIVE METABOLIC PANEL
ALT: 10 U/L — ABNORMAL LOW (ref 14–54)
AST: 17 U/L (ref 15–41)
Albumin: 2.8 g/dL — ABNORMAL LOW (ref 3.5–5.0)
Alkaline Phosphatase: 65 U/L (ref 38–126)
Anion gap: 11 (ref 5–15)
BILIRUBIN TOTAL: 0.6 mg/dL (ref 0.3–1.2)
BUN: 36 mg/dL — AB (ref 6–20)
CALCIUM: 8.7 mg/dL — AB (ref 8.9–10.3)
CO2: 19 mmol/L — ABNORMAL LOW (ref 22–32)
Chloride: 102 mmol/L (ref 101–111)
Creatinine, Ser: 1.33 mg/dL — ABNORMAL HIGH (ref 0.44–1.00)
GFR, EST AFRICAN AMERICAN: 41 mL/min — AB (ref >60)
GFR, EST NON AFRICAN AMERICAN: 35 mL/min — AB (ref >60)
Glucose, Bld: 110 mg/dL — ABNORMAL HIGH (ref 65–99)
Potassium: 4.9 mmol/L (ref 3.5–5.1)
Sodium: 132 mmol/L — ABNORMAL LOW (ref 135–145)
TOTAL PROTEIN: 6.4 g/dL — AB (ref 6.5–8.1)

## 2017-09-24 LAB — URINALYSIS, ROUTINE W REFLEX MICROSCOPIC
Bilirubin Urine: NEGATIVE
Glucose, UA: NEGATIVE mg/dL
Hgb urine dipstick: NEGATIVE
Ketones, ur: NEGATIVE mg/dL
Leukocytes, UA: NEGATIVE
NITRITE: NEGATIVE
PH: 5 (ref 5.0–8.0)
Protein, ur: NEGATIVE mg/dL
Specific Gravity, Urine: 1.016 (ref 1.005–1.030)

## 2017-09-24 LAB — CBC WITH DIFFERENTIAL/PLATELET
BASOS ABS: 0 10*3/uL (ref 0.0–0.1)
Basophils Relative: 0 %
EOS ABS: 0.1 10*3/uL (ref 0.0–0.7)
EOS PCT: 1 %
HCT: 29.7 % — ABNORMAL LOW (ref 36.0–46.0)
Hemoglobin: 9.5 g/dL — ABNORMAL LOW (ref 12.0–15.0)
Lymphocytes Relative: 5 %
Lymphs Abs: 0.3 10*3/uL — ABNORMAL LOW (ref 0.7–4.0)
MCH: 28.4 pg (ref 26.0–34.0)
MCHC: 32 g/dL (ref 30.0–36.0)
MCV: 88.7 fL (ref 78.0–100.0)
MONO ABS: 0.7 10*3/uL (ref 0.1–1.0)
Monocytes Relative: 14 %
Neutro Abs: 4.1 10*3/uL (ref 1.7–7.7)
Neutrophils Relative %: 80 %
PLATELETS: 421 10*3/uL — AB (ref 150–400)
RBC: 3.35 MIL/uL — ABNORMAL LOW (ref 3.87–5.11)
RDW: 15.4 % (ref 11.5–15.5)
WBC: 5.2 10*3/uL (ref 4.0–10.5)

## 2017-09-24 LAB — ETHANOL

## 2017-09-24 LAB — DIGOXIN LEVEL: DIGOXIN LVL: 0.8 ng/mL (ref 0.8–2.0)

## 2017-09-24 MED ORDER — ACETAMINOPHEN 325 MG PO TABS
650.0000 mg | ORAL_TABLET | Freq: Once | ORAL | Status: AC
  Administered 2017-09-24: 650 mg via ORAL
  Filled 2017-09-24: qty 2

## 2017-09-24 MED ORDER — TETANUS-DIPHTH-ACELL PERTUSSIS 5-2.5-18.5 LF-MCG/0.5 IM SUSP
0.5000 mL | Freq: Once | INTRAMUSCULAR | Status: AC
  Administered 2017-09-24: 0.5 mL via INTRAMUSCULAR
  Filled 2017-09-24: qty 0.5

## 2017-09-24 MED ORDER — LIDOCAINE-EPINEPHRINE (PF) 2 %-1:200000 IJ SOLN
10.0000 mL | Freq: Once | INTRAMUSCULAR | Status: AC
  Administered 2017-09-24: 10 mL
  Filled 2017-09-24: qty 20

## 2017-09-24 MED ORDER — SODIUM CHLORIDE 0.9 % IV BOLUS (SEPSIS)
1000.0000 mL | Freq: Once | INTRAVENOUS | Status: AC
  Administered 2017-09-24: 1000 mL via INTRAVENOUS

## 2017-09-24 MED ORDER — LORAZEPAM 0.5 MG PO TABS
0.5000 mg | ORAL_TABLET | Freq: Once | ORAL | Status: AC
  Administered 2017-09-24: 0.5 mg via ORAL
  Filled 2017-09-24: qty 1

## 2017-09-24 NOTE — ED Notes (Signed)
Pt stable, ambulatory, states understanding of discharge instructions, daughter took home clothing to wash.

## 2017-09-24 NOTE — ED Provider Notes (Signed)
[facility] EMERGENCY DEPARTMENT Provider Note   CSN: [id number] Arrival date & time: 09/24/17  1501     History   Chief Complaint Chief Complaint  Patient presents with  . Fall  . Head Laceration    HPI Jamie Le is a 84 y.o. female.  Patient is a resident at [facility] assisted living after a fall earlier today. She has a history of dementia and is considered an unreliable historian. Daughter at bedside who was not with her when she fell. The patient has had multiple falls recently resulting in ED visits on 09/01/17 and 09/09/17. Today she has a laceration on her left forehead, complains of a headache, neck and left shoulder pain.   The history is provided by the patient and the EMS personnel. No language interpreter was used.  Fall   Head Laceration     Past Medical History:  Diagnosis Date  . A-fib (HCC)   . Acidosis   . Alcohol withdrawal (HCC)   . Alcoholism (HCC)   . Atrial flutter (HCC)   . CHF (congestive heart failure) (HCC)   . Dementia     Patient Active Problem List   Diagnosis Date Noted  . Lower urinary tract infectious disease   . Pressure injury of skin 07/25/2017  . UTI (urinary tract infection) 07/24/2017  . Acute encephalopathy 07/24/2017  . ETOH abuse 07/24/2017  . AKI (acute kidney injury) (HCC) 07/24/2017    History reviewed. No pertinent surgical history.  OB History    No data available       Home Medications    Prior to Admission medications   Medication Sig Start Date End Date Taking? Authorizing Provider  Calcium Carbonate-Vitamin D3 (CALCIUM 600-D) 600-400 MG-UNIT TABS Take 0.5 tablets by mouth daily.    [provider]  digoxin (LANOXIN) 0.125 MG tablet Take 0.125 mg by mouth daily.    [provider]  docusate sodium (COLACE) 100 MG capsule Take 100 mg by mouth daily as needed for mild constipation.    [provider]  DULoxetine (CYMBALTA) 30 MG capsule Take 60 mg by mouth  daily.     [provider]  lisinopril (PRINIVIL,ZESTRIL) 10 MG tablet Take 10 mg by mouth daily.    [provider]  meclizine (ANTIVERT) 12.5 MG tablet Take 12.5 mg by mouth 3 (three) times daily as needed for dizziness.    [provider]  Melatonin 3 MG TABS Take 3 mg by mouth daily.    [provider]  Metoprolol Succinate 50 MG CS24 Take 50 mg by mouth daily.    [provider]  neomycin-bacitracin-polymyxin (NEOSPORIN) [phone] ointment Apply 1 application topically 2 (two) times a week. Cleanse skin tear to left forearm with wound cleanser, pat dry with gauze, apply triple antibiotic ointment, cover with non-adherent dressing. Secure with Kerlix twice a week until healed, then DC    [provider]  omeprazole (PRILOSEC) 20 MG capsule Take 20 mg by mouth daily.    [provider]  spironolactone (ALDACTONE) 25 MG tablet Take 25 mg by mouth 2 (two) times daily.    [provider]    Family History History reviewed. No pertinent family history.  Social History Social History   Tobacco Use  . Smoking status: Unknown If Ever Smoked  . Smokeless tobacco: Never Used  Substance Use Topics  . Alcohol use: Yes    Comment: Moderate use  . Drug use: No     Allergies  Patient has no known allergies.   Review of Systems Review of Systems  Unable to perform ROS: Dementia     Physical Exam Updated Vital Signs BP (!) 142/77   Pulse 92   Temp 99 F (37.2 C) (Oral)   Resp 17   Ht 5\' 4"  (1.626 m)   Wt 59 kg (130 lb)   SpO2 100%   BMI 22.31 kg/m   Physical Exam  Constitutional: She appears well-developed and well-nourished. No distress.  HENT:  Head: Normocephalic.  Nose: Nose normal.  Eyes: EOM are normal. Pupils are equal, round, and reactive to light.  Neck: Normal range of motion. Neck supple.  Cardiovascular: Normal rate.  No murmur heard. Pulmonary/Chest: Effort normal. She has no wheezes.  She has no rales.  Chest wall has no bruising or redness.   Abdominal: Soft. There is no tenderness. There is no guarding.  Abdominal wall has no bruising or redness.   Musculoskeletal: Normal range of motion.  Neurological: She is alert.  Skin: Skin is warm and dry.  9 cm full thickness laceration left forehead. No active bleeding. Multiple bruises over body in various stages of healing. Bandage to right wrist, per daughter, from previous fall.      ED Treatments / Results  Labs (all labs ordered are listed, but only abnormal results are displayed) Labs Reviewed  COMPREHENSIVE METABOLIC PANEL  ETHANOL  CBC WITH DIFFERENTIAL/PLATELET  DIGOXIN LEVEL  URINALYSIS, ROUTINE W REFLEX MICROSCOPIC    EKG  EKG Interpretation None       Radiology No results found.  Procedures .Marland KitchenLaceration Repair Date/Time: 09/24/2017 7:26 PM Performed by: Elpidio Anis, PA-C Authorized by: Elpidio Anis, PA-C   Consent:    Consent obtained:  Verbal   Consent given by:  Patient (and daughter) Anesthesia (see MAR for exact dosages):    Anesthesia method:  Local infiltration   Local anesthetic:  Lidocaine 2% WITH epi Laceration details:    Location:  Face   Face location:  Forehead   Length (cm):  9 Repair type:    Repair type:  Intermediate Pre-procedure details:    Preparation:  Patient was prepped and draped in usual sterile fashion Exploration:    Hemostasis achieved with:  Direct pressure and epinephrine   Contaminated: no   Treatment:    Area cleansed with:  Betadine and saline   Amount of cleaning:  Standard Subcutaneous repair:    Suture size:  5-0   Suture material:  Vicryl   Suture technique:  Horizontal mattress   Number of sutures:  1 Skin repair:    Repair method:  Sutures   Suture size:  6-0   Suture material:  Prolene   Suture technique:  Running   Number of sutures:  26 Approximation:    Approximation:  Close   Vermilion border: well-aligned   Post-procedure  details:    Patient tolerance of procedure:  Tolerated well, no immediate complications   (including critical care time)  Medications Ordered in ED Medications  acetaminophen (TYLENOL) tablet 650 mg (not administered)     Initial Impression / Assessment and Plan / ED Course  I have reviewed the triage vital signs and the nursing notes.  Pertinent labs & imaging results that were available during my care of the patient were reviewed by me and considered in my medical decision making (see chart for details).     Patient presents after fall from [facility] assisted living that was unwitnessed. Per daughter the patient is wheelchair  bound and falls when she tries to get up to walk.   She is awake and alert. Confused, at baseline. CT head and neck and xray of left shoulder are non-acute.   Sutures placed per above note. IVF's provided as she appears slightly dehydrated (elevated Cr from recent). Dr. Ethelda Chickjacubowitz discussed disposition with daughter who prefers that she receive IVF's and discharge back to [facility].   Will include after care instructions, recommendation to have lab studies rechecked in one week. She has verrugated lesion on the wrist that will need to be rechecked and possibly analyzed.  She was given Ativan for mild agitation and is calm and comfortable.   Final Clinical Impressions(s) / ED Diagnoses   Final diagnoses:  None   1. Fall 2. Mild AKI 3. Dehydration 4. Facial laceration   ED Discharge Orders    None       Elpidio AnisUpstill, Braeley Buskey, Cordelia Poche-C 09/24/17 Ninfa Linden1938    Doug SouJacubowitz, Sam, MD 09/25/17 [phone]

## 2017-09-24 NOTE — ED Notes (Signed)
[facility] here for transport. 
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
## 2017-09-24 NOTE — ED Notes (Signed)
Patient transported to X-ray 

## 2017-09-24 NOTE — Discharge Instructions (Signed)
You can be discharged back to your assisted living center. Your doctor will need to recheck your blood work in one week to insure your kidney function normalizes to baseline and hemoglobin does not drop further. Lab results are included below for convenience and reference. Your caregivers are encouraged to change your bandages everyday and keep wounds clean.   Results for orders placed or performed during the hospital encounter of 09/24/17  Comprehensive metabolic panel  Result Value Ref Range   Sodium 132 (L) 135 - 145 mmol/L   Potassium 4.9 3.5 - 5.1 mmol/L   Chloride 102 101 - 111 mmol/L   CO2 19 (L) 22 - 32 mmol/L   Glucose, Bld 110 (H) 65 - 99 mg/dL   BUN 36 (H) 6 - 20 mg/dL   Creatinine, Ser 4.091.33 (H) 0.44 - 1.00 mg/dL   Calcium 8.7 (L) 8.9 - 10.3 mg/dL   Total Protein 6.4 (L) 6.5 - 8.1 g/dL   Albumin 2.8 (L) 3.5 - 5.0 g/dL   AST 17 15 - 41 U/L   ALT 10 (L) 14 - 54 U/L   Alkaline Phosphatase 65 38 - 126 U/L   Total Bilirubin 0.6 0.3 - 1.2 mg/dL   GFR calc non Af Amer 35 (L) >60 mL/min   GFR calc Af Amer 41 (L) >60 mL/min   Anion gap 11 5 - 15  Ethanol  Result Value Ref Range   Alcohol, Ethyl (B) <10 <10 mg/dL  CBC with Differential  Result Value Ref Range   WBC 5.2 4.0 - 10.5 K/uL   RBC 3.35 (L) 3.87 - 5.11 MIL/uL   Hemoglobin 9.5 (L) 12.0 - 15.0 g/dL   HCT 81.129.7 (L) 91.436.0 - 78.246.0 %   MCV 88.7 78.0 - 100.0 fL   MCH 28.4 26.0 - 34.0 pg   MCHC 32.0 30.0 - 36.0 g/dL   RDW 95.615.4 21.311.5 - 08.615.5 %   Platelets 421 (H) 150 - 400 K/uL   Neutrophils Relative % 80 %   Neutro Abs 4.1 1.7 - 7.7 K/uL   Lymphocytes Relative 5 %   Lymphs Abs 0.3 (L) 0.7 - 4.0 K/uL   Monocytes Relative 14 %   Monocytes Absolute 0.7 0.1 - 1.0 K/uL   Eosinophils Relative 1 %   Eosinophils Absolute 0.1 0.0 - 0.7 K/uL   Basophils Relative 0 %   Basophils Absolute 0.0 0.0 - 0.1 K/uL  Digoxin level  Result Value Ref Range   Digoxin Level 0.8 0.8 - 2.0 ng/mL  Urinalysis, Routine w reflex microscopic  Result  Value Ref Range   Color, Urine YELLOW YELLOW   APPearance CLEAR CLEAR   Specific Gravity, Urine 1.016 1.005 - 1.030   pH 5.0 5.0 - 8.0   Glucose, UA NEGATIVE NEGATIVE mg/dL   Hgb urine dipstick NEGATIVE NEGATIVE   Bilirubin Urine NEGATIVE NEGATIVE   Ketones, ur NEGATIVE NEGATIVE mg/dL   Protein, ur NEGATIVE NEGATIVE mg/dL   Nitrite NEGATIVE NEGATIVE   Leukocytes, UA NEGATIVE NEGATIVE

## 2017-09-24 NOTE — ED Triage Notes (Signed)
Pt arrived from [facility] after unwitnessed fall. Staff heard pt yelling and was found face down.  Lac and evulsion above left eyebrow.  Right hip pain.  Hx of right eye cataract with surgery.

## 2017-09-24 NOTE — ED Notes (Signed)
Contacted [facility] for tx back to [facility]

## 2018-09-01 DEATH — deceased

## 2019-10-11 IMAGING — CT CT HEAD W/O CM
3 series · 15 of 46 positions shown, 18 images · non-contrast
Comparison: None.

CLINICAL DATA: Headache

EXAM:
CT HEAD WITHOUT CONTRAST
TECHNIQUE: Contiguous axial images were obtained from the base of the skull
through the vertex without intravenous contrast.

[Series 2: head wo · axial · 0.47mm/px · z∈[-43,+77]mm · 9 of 29 slices shown, 12 images]
[im 3/29  brain]
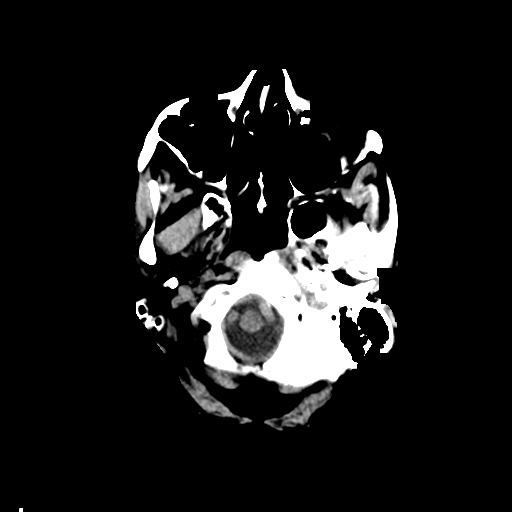
[im 3/29  bone]
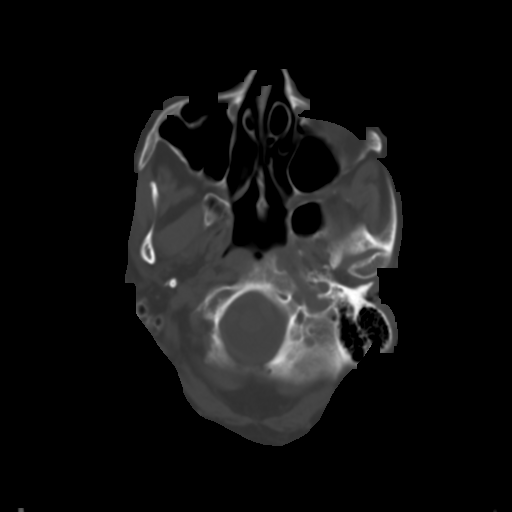
[im 6/29  brain]
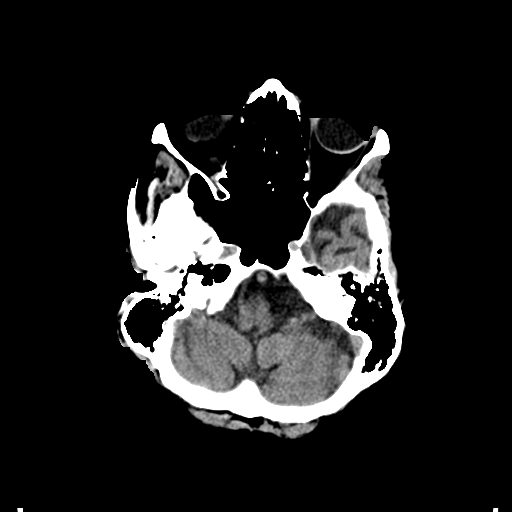
[im 9/29  brain]
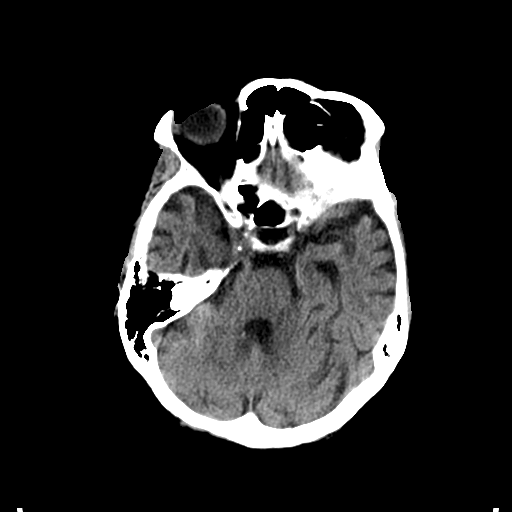
[im 12/29  brain]
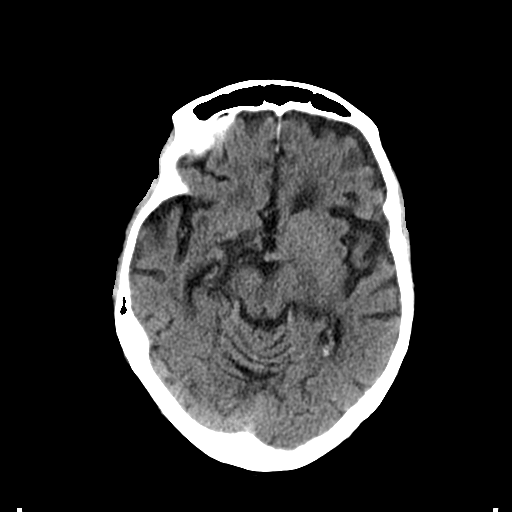
[im 15/29  brain]
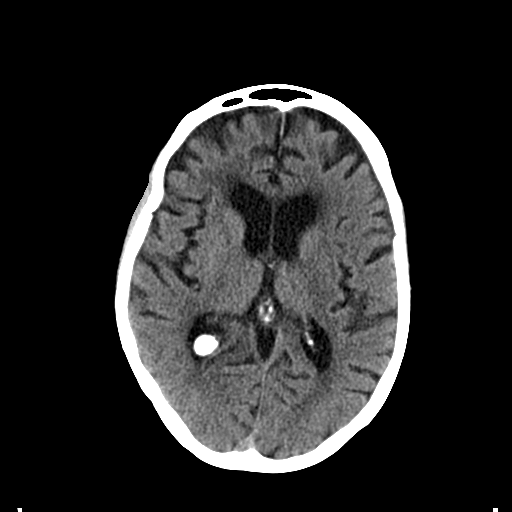
[im 15/29  bone]
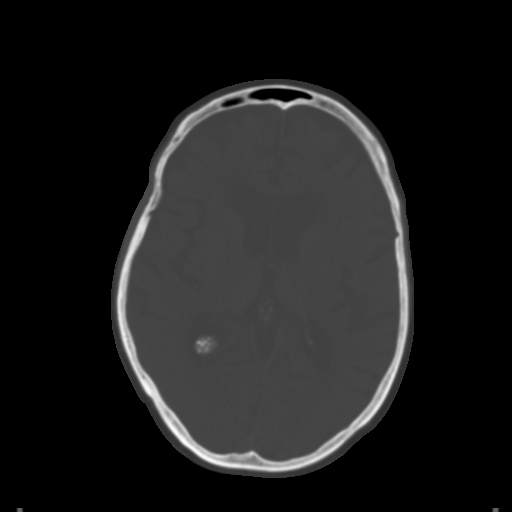
[im 18/29  brain]
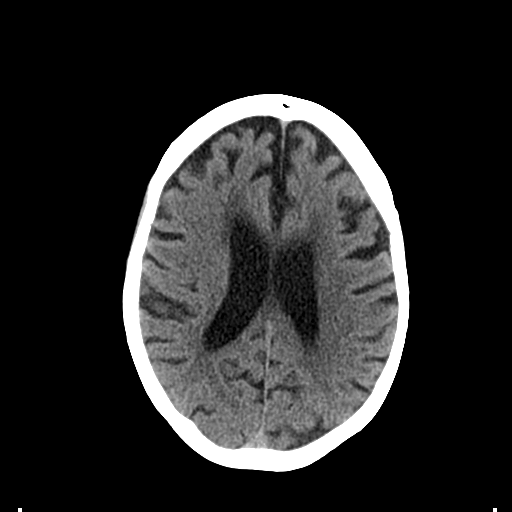
[im 21/29  brain]
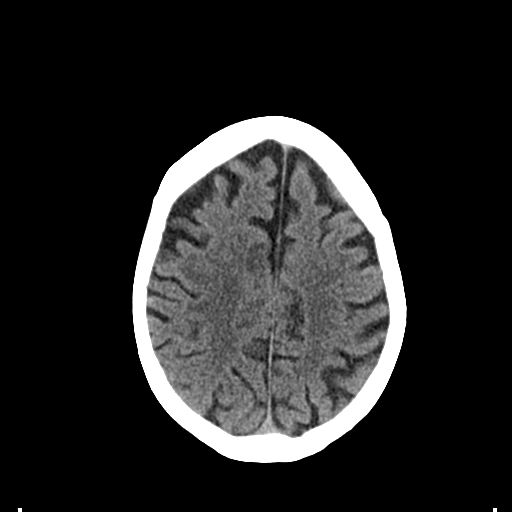
[im 24/29  brain]
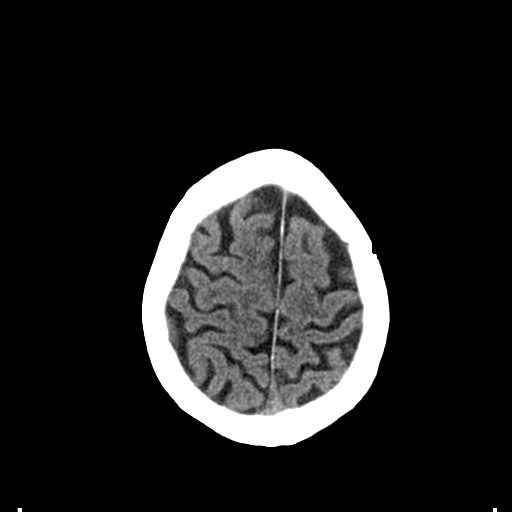
[im 27/29  brain]
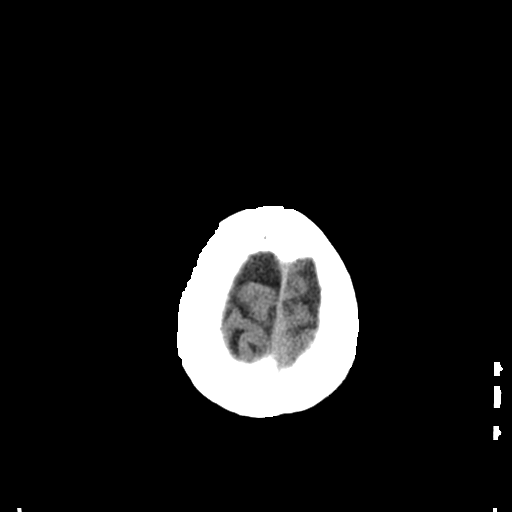
[im 27/29  bone]
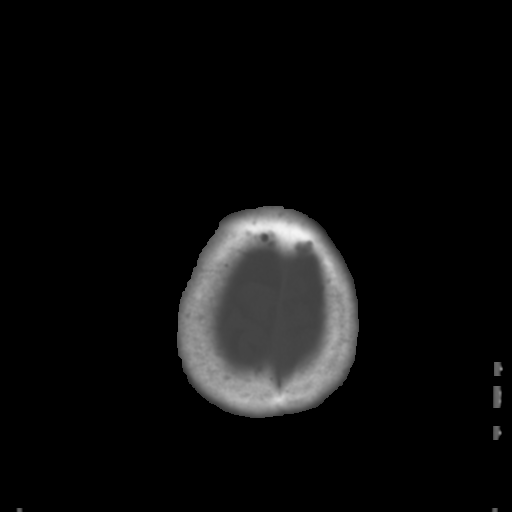

[Series 5: coronal soft tissue · coronal · 0.29mm/px · 3 of 67 slices shown]
[im 23/67  brain]
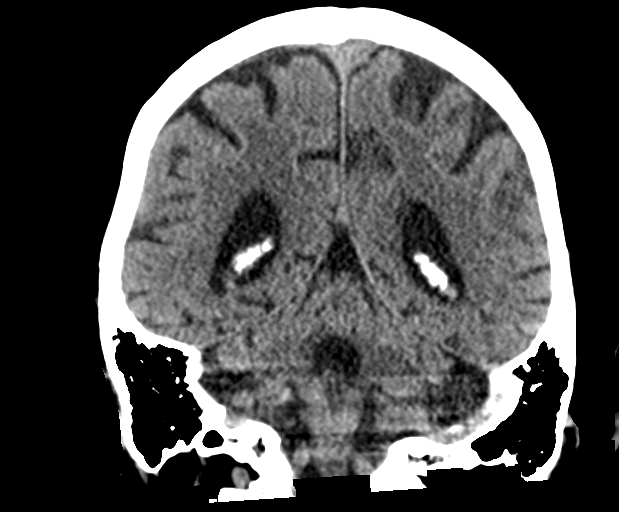
[im 30/67  brain]
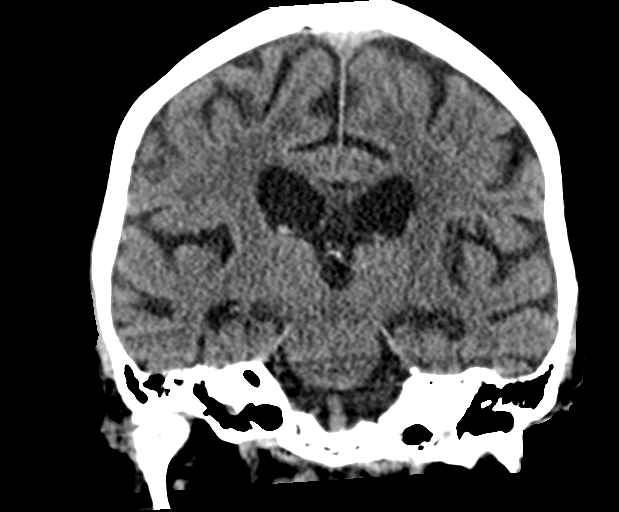
[im 37/67  brain]
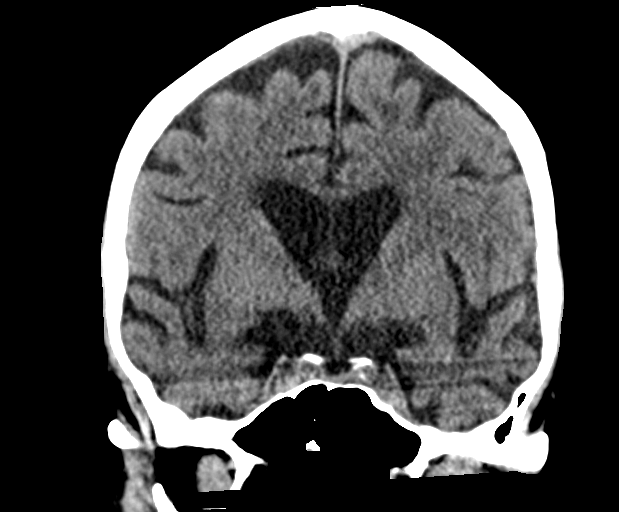

[Series 6: sagittal soft tissue · sagittal · 0.29mm/px · 3 of 59 slices shown]
[im 20/59  brain]
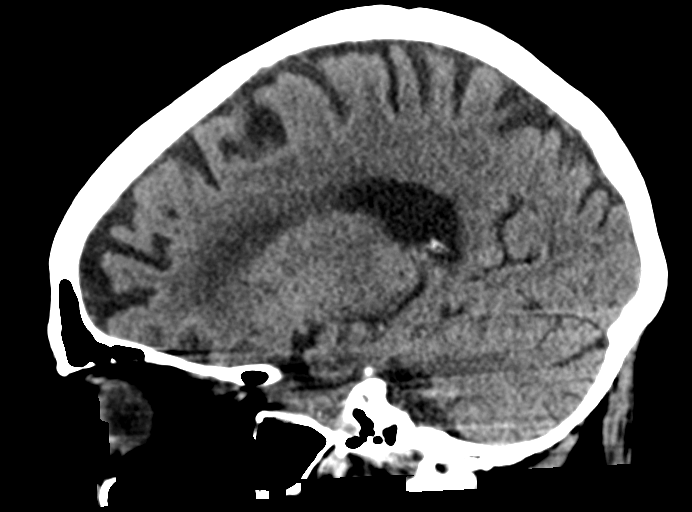
[im 30/59  brain]
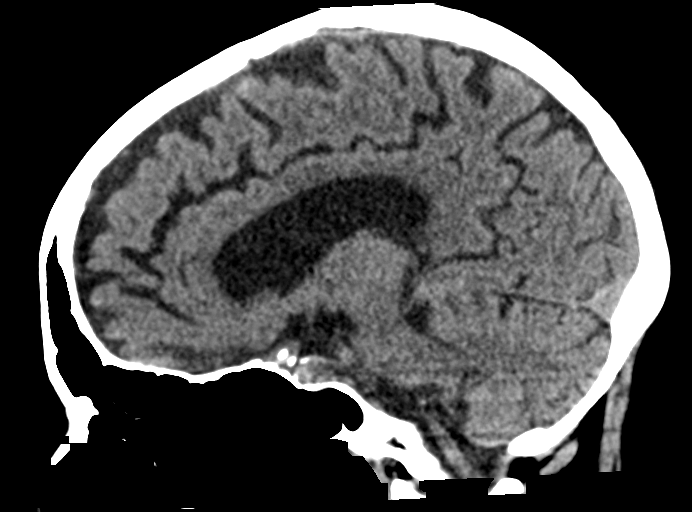
[im 39/59  brain]
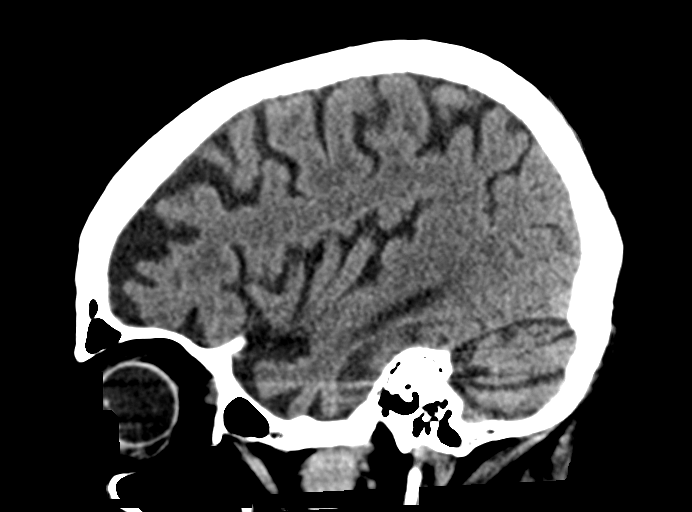

[15 of 46 positions shown; findings below may reference images not displayed]

FINDINGS: Brain: No acute territorial infarction, hemorrhage or intracranial
mass is visualized. Extensive atrophy. Mild to moderate small vessel
ischemic changes of the white matter. Prominent ventricles felt
secondary to atrophy.

Vascular: No hyperdense vessels.  Carotid artery calcification.

Skull: No fracture

Sinuses/Orbits: No acute finding.

Other: None
IMPRESSION: 1. No CT evidence for acute intracranial abnormality.
2. Atrophy and small vessel ischemic changes of the white matter.

## 2019-10-11 IMAGING — CR DG CHEST 2V
2 series · 2 of 2 positions shown · non-contrast
Comparison: None.

CLINICAL DATA: Weakness, falls

EXAM:
CHEST  2 VIEW

[w chest lat]
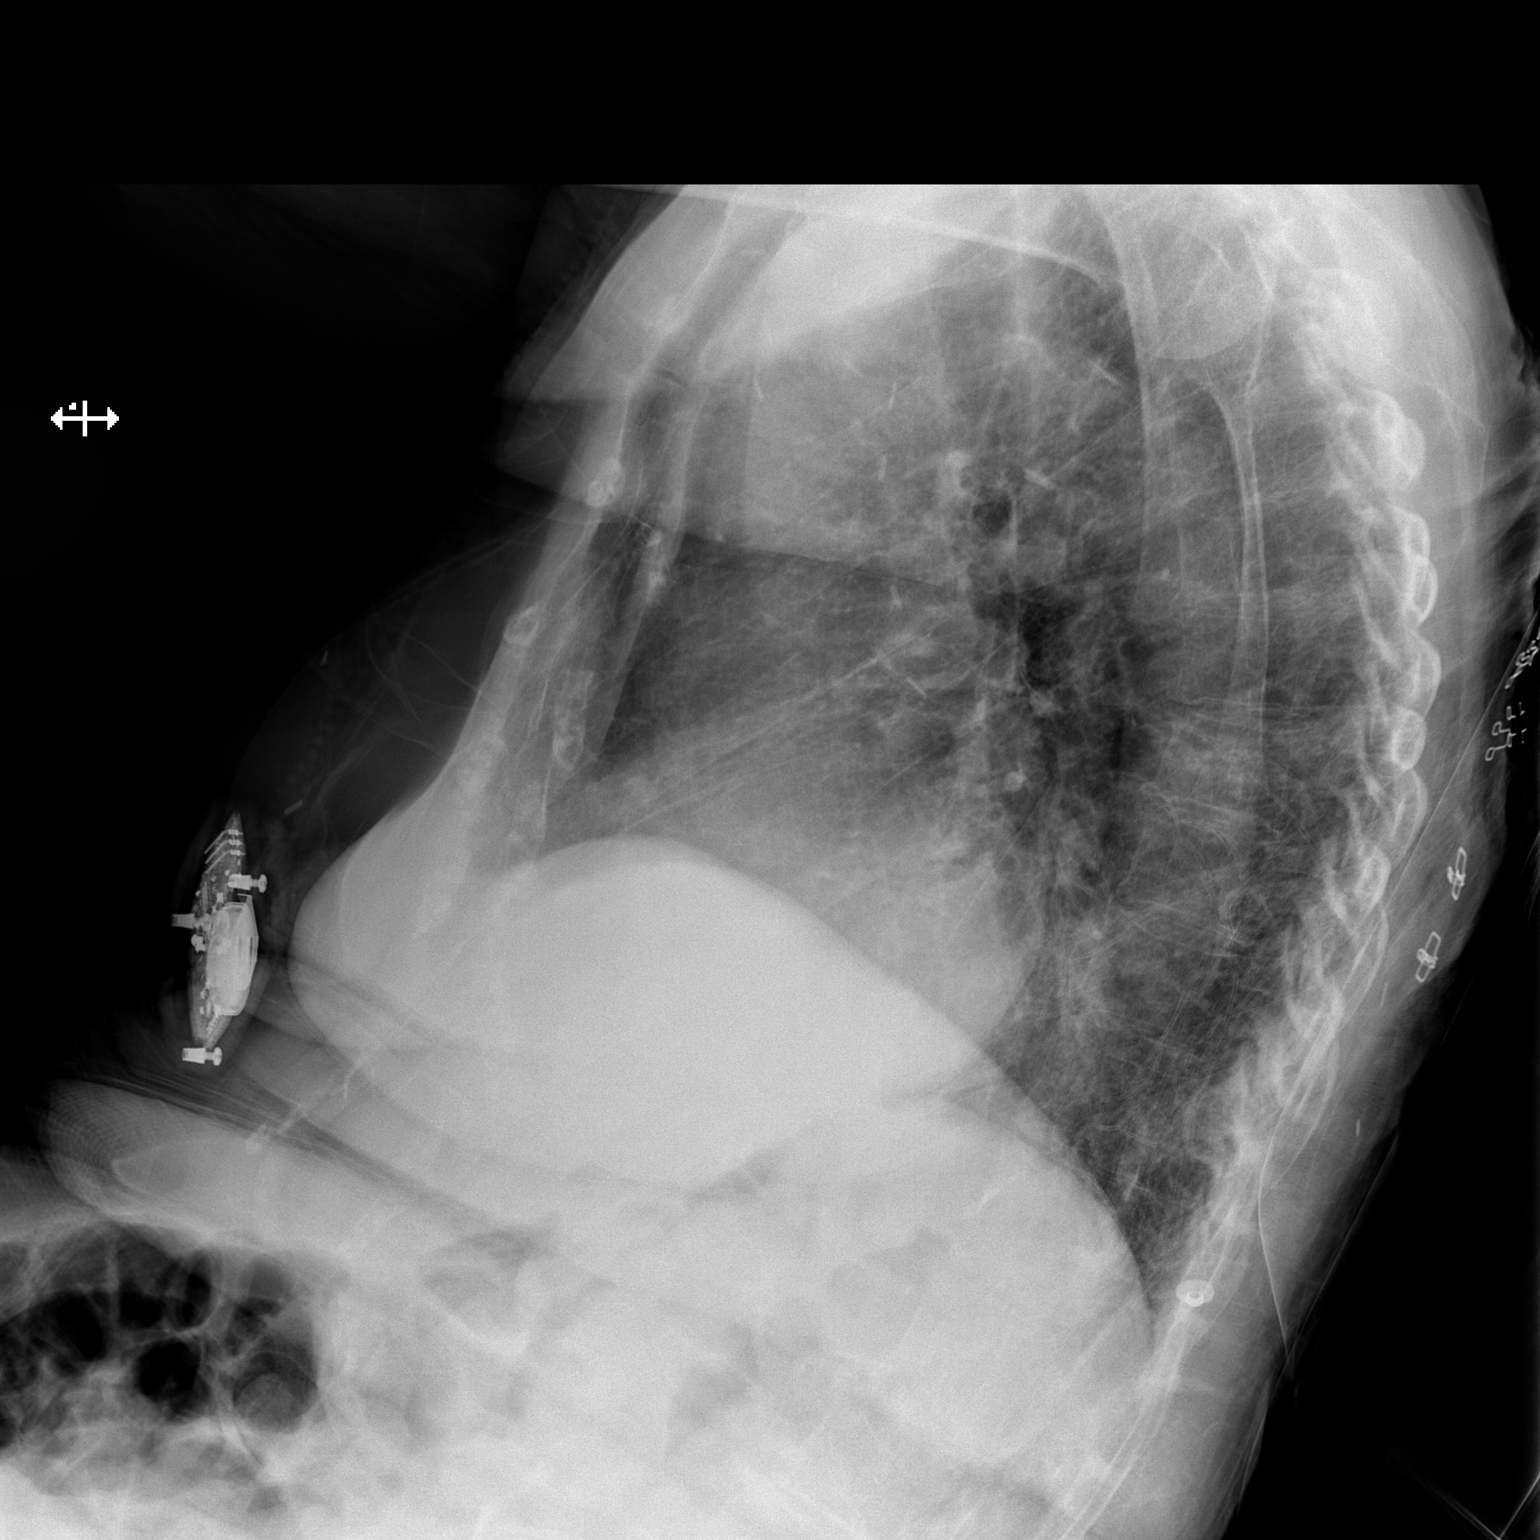

[x chest ap]
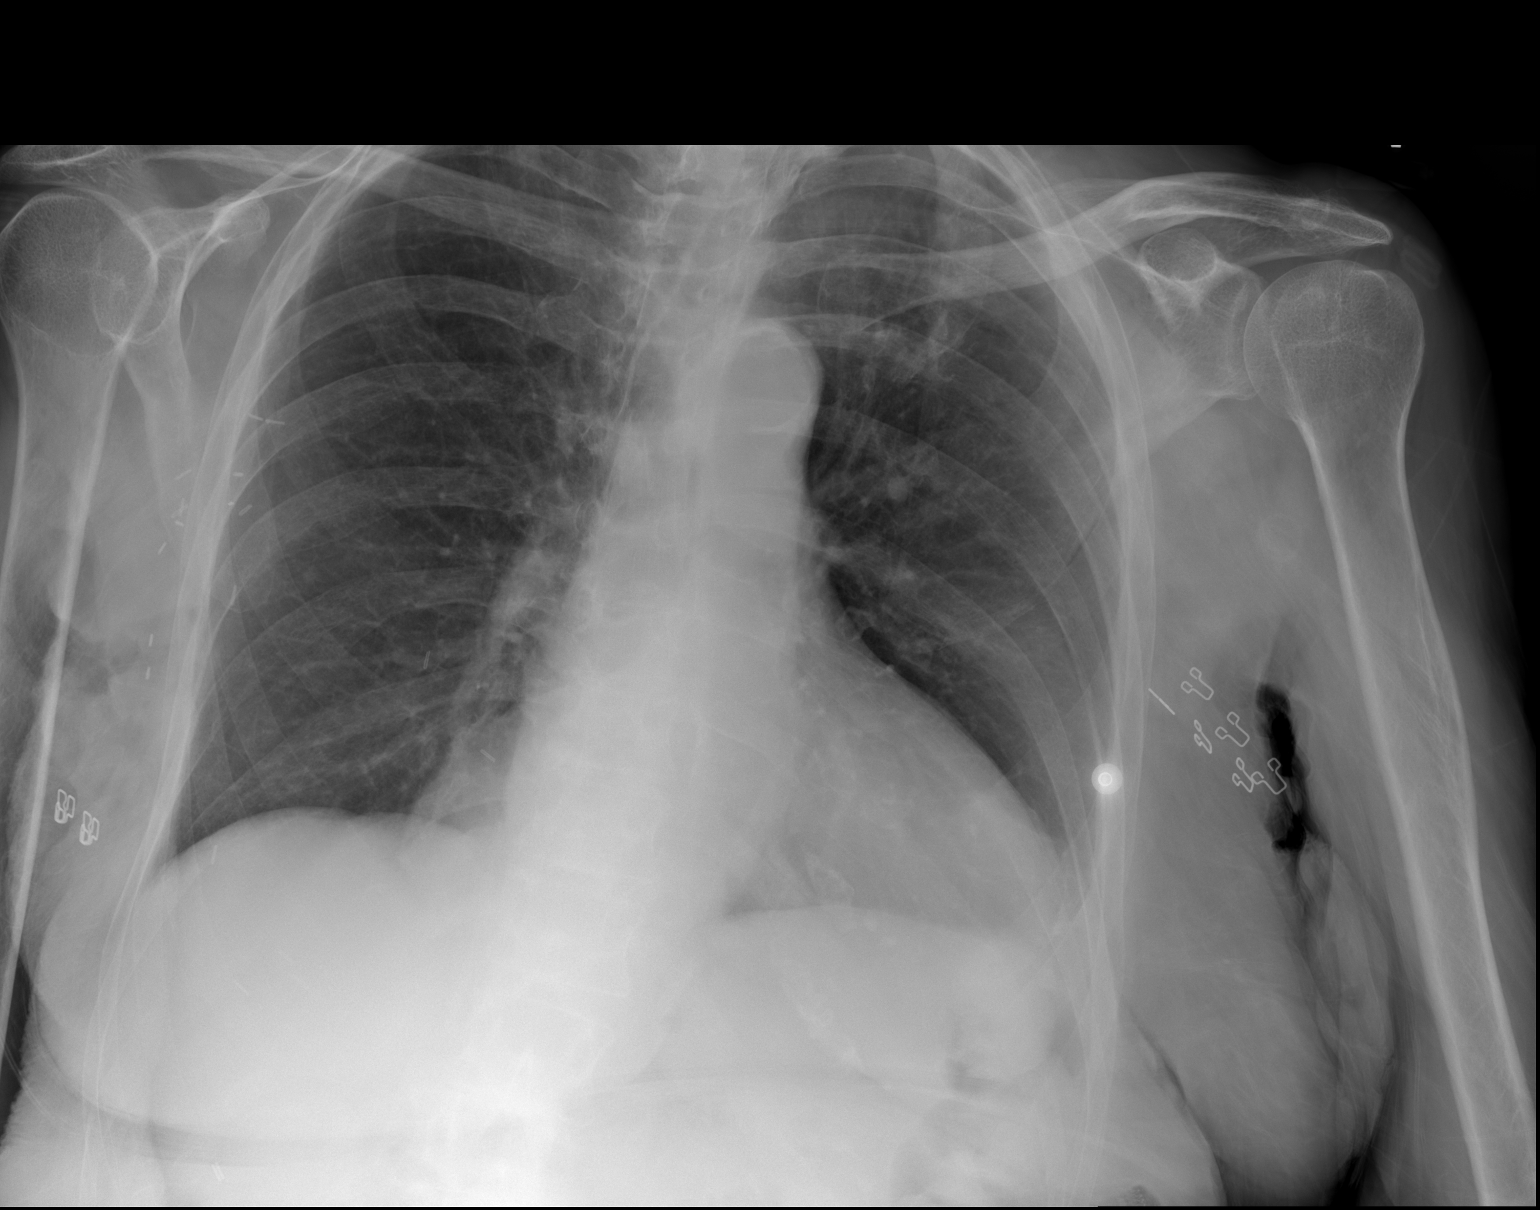

[2 of 2 positions shown; findings below may reference images not displayed]

FINDINGS: No acute pulmonary infiltrate or effusion. Borderline to mild
cardiomegaly. Aortic atherosclerosis. No pneumothorax. Postsurgical
changes in the right axillary region. Minimal convex opacity at the
right cardio phrenic sulcus, could relate to possible hiatal hernia.
IMPRESSION: 1. Negative for acute infiltrate or edema
2. Borderline to mild cardiomegaly.
3. Convex opacity at the right cardiophrenic sulcus, possible hiatal
hernia, short interval radiographic follow-up recommended.
